# Patient Record
Sex: Female | Born: 1945 | Race: Black or African American | Hispanic: No | Smoking: Never smoker
Health system: Southern US, Community
[De-identification: ages and names within clinical notes are randomized; demographics above are authoritative.]

## PROBLEM LIST (undated history)

## (undated) DIAGNOSIS — R7303 Prediabetes: Secondary | ICD-10-CM

## (undated) DIAGNOSIS — C649 Malignant neoplasm of unspecified kidney, except renal pelvis: Secondary | ICD-10-CM

## (undated) DIAGNOSIS — F32A Depression, unspecified: Secondary | ICD-10-CM

## (undated) DIAGNOSIS — G629 Polyneuropathy, unspecified: Secondary | ICD-10-CM

## (undated) DIAGNOSIS — K219 Gastro-esophageal reflux disease without esophagitis: Secondary | ICD-10-CM

## (undated) DIAGNOSIS — R569 Unspecified convulsions: Secondary | ICD-10-CM

## (undated) DIAGNOSIS — I10 Essential (primary) hypertension: Secondary | ICD-10-CM

## (undated) DIAGNOSIS — M1990 Unspecified osteoarthritis, unspecified site: Secondary | ICD-10-CM

## (undated) DIAGNOSIS — F329 Major depressive disorder, single episode, unspecified: Secondary | ICD-10-CM

## (undated) DIAGNOSIS — F419 Anxiety disorder, unspecified: Secondary | ICD-10-CM

## (undated) DIAGNOSIS — H3530 Unspecified macular degeneration: Secondary | ICD-10-CM

## (undated) HISTORY — PX: ABDOMINAL HYSTERECTOMY: SHX81

## (undated) HISTORY — DX: Polyneuropathy, unspecified: G62.9

## (undated) HISTORY — PX: CATARACT EXTRACTION: SUR2

---

## 2001-12-04 ENCOUNTER — Encounter: Payer: Self-pay | Admitting: Orthopedic Surgery

## 2001-12-04 ENCOUNTER — Ambulatory Visit (HOSPITAL_COMMUNITY): Admission: RE | Admit: 2001-12-04 | Discharge: 2001-12-04 | Payer: Self-pay | Admitting: Orthopedic Surgery

## 2002-01-13 ENCOUNTER — Encounter (HOSPITAL_COMMUNITY): Admission: RE | Admit: 2002-01-13 | Discharge: 2002-02-12 | Payer: Self-pay | Admitting: Orthopedic Surgery

## 2009-07-07 ENCOUNTER — Ambulatory Visit (HOSPITAL_COMMUNITY): Admission: RE | Admit: 2009-07-07 | Discharge: 2009-07-07 | Payer: Self-pay | Admitting: Ophthalmology

## 2010-06-26 LAB — HEMOGLOBIN AND HEMATOCRIT, BLOOD: HCT: 37 % (ref 36.0–46.0)

## 2010-06-26 LAB — BASIC METABOLIC PANEL
CO2: 27 mEq/L (ref 19–32)
Chloride: 107 mEq/L (ref 96–112)
GFR calc Af Amer: >60 mL/min (ref >60)
Glucose, Bld: 102 mg/dL — ABNORMAL HIGH (ref 70–99)
Potassium: 4.1 mEq/L (ref 3.5–5.1)
Sodium: 139 mEq/L (ref 135–145)

## 2011-02-28 ENCOUNTER — Encounter (HOSPITAL_COMMUNITY): Payer: BC Managed Care – PPO

## 2011-03-08 ENCOUNTER — Encounter (HOSPITAL_COMMUNITY): Admission: RE | Payer: Self-pay | Source: Ambulatory Visit

## 2011-03-08 ENCOUNTER — Ambulatory Visit (HOSPITAL_COMMUNITY): Admission: RE | Admit: 2011-03-08 | Payer: BC Managed Care – PPO | Source: Ambulatory Visit | Admitting: Ophthalmology

## 2011-03-08 SURGERY — PHACOEMULSIFICATION, CATARACT, WITH IOL INSERTION
Anesthesia: Monitor Anesthesia Care | Site: Eye | Laterality: Right

## 2012-06-03 NOTE — Patient Instructions (Addendum)
Your procedure is scheduled on: 06/09/2012  Report to [facility] at  730    AM.  Call this number if you have problems the morning of surgery: [phone]   Do not eat food or drink liquids :After Midnight.      Take these medicines the morning of surgery with A SIP OF WATER: none   Do not wear jewelry, make-up or nail polish.  Do not wear lotions, powders, or perfumes.   Do not shave 48 hours prior to surgery.  Do not bring valuables to the hospital.  Contacts, dentures or bridgework may not be worn into surgery.  Leave suitcase in the car. After surgery it may be brought to your room.  For patients admitted to the hospital, checkout time is 11:00 AM the day of discharge.   Patients discharged the day of surgery will not be allowed to drive home.  :     Please read over the following fact sheets that you were given: Coughing and Deep Breathing, Surgical Site Infection Prevention, Anesthesia Post-op Instructions and Care and Recovery After Surgery    Cataract A cataract is a clouding of the lens of the eye. When a lens becomes cloudy, vision is reduced based on the degree and nature of the clouding. Many cataracts reduce vision to some degree. Some cataracts make people more near-sighted as they develop. Other cataracts increase glare. Cataracts that are ignored and become worse can sometimes look white. The white color can be seen through the pupil. CAUSES   Aging. However, cataracts may occur at any age, even in newborns.   Certain drugs.   Trauma to the eye.   Certain diseases such as diabetes.   Specific eye diseases such as chronic inflammation inside the eye or a sudden attack of a rare form of glaucoma.   Inherited or acquired medical problems.  SYMPTOMS   Gradual, progressive drop in vision in the affected eye.   Severe, rapid visual loss. This most often happens when trauma is the cause.  DIAGNOSIS  To detect a cataract, an eye doctor examines the lens. Cataracts are  best diagnosed with an exam of the eyes with the pupils enlarged (dilated) by drops.  TREATMENT  For an early cataract, vision may improve by using different eyeglasses or stronger lighting. If that does not help your vision, surgery is the only effective treatment. A cataract needs to be surgically removed when vision loss interferes with your everyday activities, such as driving, reading, or watching TV. A cataract may also have to be removed if it prevents examination or treatment of another eye problem. Surgery removes the cloudy lens and usually replaces it with a substitute lens (intraocular lens, IOL).  At a time when both you and your doctor agree, the cataract will be surgically removed. If you have cataracts in both eyes, only one is usually removed at a time. This allows the operated eye to heal and be out of danger from any possible problems after surgery (such as infection or poor wound healing). In rare cases, a cataract may be doing damage to your eye. In these cases, your caregiver may advise surgical removal right away. The vast majority of people who have cataract surgery have better vision afterward. HOME CARE INSTRUCTIONS  If you are not planning surgery, you may be asked to do the following:  Use different eyeglasses.   Use stronger or brighter lighting.   Ask your eye doctor about reducing your medicine dose or changing  medicines if it is thought that a medicine caused your cataract. Changing medicines does not make the cataract go away on its own.   Become familiar with your surroundings. Poor vision can lead to injury. Avoid bumping into things on the affected side. You are at a higher risk for tripping or falling.   Exercise extreme care when driving or operating machinery.   Wear sunglasses if you are sensitive to bright light or experiencing problems with glare.  SEEK IMMEDIATE MEDICAL CARE IF:   You have a worsening or sudden vision loss.   You notice redness,  swelling, or increasing pain in the eye.   You have a fever.  Document Released: 03/19/2005 Document Revised: 03/08/2011 Document Reviewed: 11/10/2010 [facility] Patient Information 2012 [place].PATIENT INSTRUCTIONS POST-ANESTHESIA  IMMEDIATELY FOLLOWING SURGERY:  Do not drive or operate machinery for the first twenty four hours after surgery.  Do not make any important decisions for twenty four hours after surgery or while taking narcotic pain medications or sedatives.  If you develop intractable nausea and vomiting or a severe headache please notify your doctor immediately.  FOLLOW-UP:  Please make an appointment with your surgeon as instructed. You do not need to follow up with anesthesia unless specifically instructed to do so.  WOUND CARE INSTRUCTIONS (if applicable):  Keep a dry clean dressing on the anesthesia/puncture wound site if there is drainage.  Once the wound has quit draining you may leave it open to air.  Generally you should leave the bandage intact for twenty four hours unless there is drainage.  If the epidural site drains for more than 36-48 hours please call the anesthesia department.  QUESTIONS?:  Please feel free to call your physician or the hospital operator if you have any questions, and they will be happy to assist you.

## 2012-06-04 ENCOUNTER — Encounter (HOSPITAL_COMMUNITY)
Admission: RE | Admit: 2012-06-04 | Discharge: 2012-06-04 | Disposition: A | Discharge status: Home / Self Care | Payer: BC Managed Care – PPO | Source: Ambulatory Visit

## 2012-06-05 ENCOUNTER — Encounter (HOSPITAL_COMMUNITY)
Admission: RE | Admit: 2012-06-05 | Discharge: 2012-06-05 | Disposition: A | Discharge status: Home / Self Care | Payer: BC Managed Care – PPO | Source: Ambulatory Visit | Attending: Ophthalmology | Admitting: Ophthalmology

## 2012-06-05 ENCOUNTER — Other Ambulatory Visit: Payer: Self-pay

## 2012-06-05 ENCOUNTER — Encounter (HOSPITAL_COMMUNITY): Payer: Self-pay

## 2012-06-05 HISTORY — DX: Depression, unspecified: F32.A

## 2012-06-05 HISTORY — DX: Unspecified convulsions: R56.9

## 2012-06-05 HISTORY — DX: Unspecified osteoarthritis, unspecified site: M19.90

## 2012-06-05 HISTORY — DX: Major depressive disorder, single episode, unspecified: F32.9

## 2012-06-05 LAB — HEMOGLOBIN AND HEMATOCRIT, BLOOD
HCT: 37.3 % (ref 36.0–46.0)
Hemoglobin: 12.7 g/dL (ref 12.0–15.0)

## 2012-06-05 LAB — BASIC METABOLIC PANEL
CO2: 25 mEq/L (ref 19–32)
GFR calc non Af Amer: 60 mL/min — ABNORMAL LOW (ref >90)
Glucose, Bld: 94 mg/dL (ref 70–99)
Potassium: 3.9 mEq/L (ref 3.5–5.1)
Sodium: 136 mEq/L (ref 135–145)

## 2012-06-06 MED ORDER — PHENYLEPHRINE HCL 2.5 % OP SOLN
OPHTHALMIC | Status: AC
  Filled 2012-06-06: qty 2

## 2012-06-06 MED ORDER — LIDOCAINE HCL 3.5 % OP GEL
OPHTHALMIC | Status: AC
  Filled 2012-06-06: qty 5

## 2012-06-06 MED ORDER — NEOMYCIN-POLYMYXIN-DEXAMETH 3.5-10000-0.1 OP OINT
TOPICAL_OINTMENT | OPHTHALMIC | Status: AC
  Filled 2012-06-06: qty 3.5

## 2012-06-06 MED ORDER — TETRACAINE HCL 0.5 % OP SOLN
OPHTHALMIC | Status: AC
  Filled 2012-06-06: qty 2

## 2012-06-06 MED ORDER — CYCLOPENTOLATE-PHENYLEPHRINE 0.2-1 % OP SOLN
OPHTHALMIC | Status: AC
  Filled 2012-06-06: qty 2

## 2012-06-06 MED ORDER — LIDOCAINE HCL (PF) 1 % IJ SOLN
INTRAMUSCULAR | Status: AC
  Filled 2012-06-06: qty 2

## 2012-06-09 ENCOUNTER — Encounter (HOSPITAL_COMMUNITY)
Admission: RE | Disposition: A | Discharge status: Home / Self Care | Payer: Self-pay | Source: Ambulatory Visit | Attending: Ophthalmology

## 2012-06-09 ENCOUNTER — Ambulatory Visit (HOSPITAL_COMMUNITY)
Admission: RE | Admit: 2012-06-09 | Discharge: 2012-06-09 | Disposition: A | Discharge status: Home / Self Care | Payer: BC Managed Care – PPO | Source: Ambulatory Visit | Attending: Ophthalmology | Admitting: Ophthalmology

## 2012-06-09 ENCOUNTER — Ambulatory Visit (HOSPITAL_COMMUNITY): Payer: BC Managed Care – PPO | Admitting: Anesthesiology

## 2012-06-09 ENCOUNTER — Encounter (HOSPITAL_COMMUNITY): Payer: Self-pay | Admitting: *Deleted

## 2012-06-09 ENCOUNTER — Encounter (HOSPITAL_COMMUNITY): Payer: Self-pay | Admitting: Anesthesiology

## 2012-06-09 DIAGNOSIS — Z01810 Encounter for preprocedural cardiovascular examination: Secondary | ICD-10-CM | POA: Insufficient documentation

## 2012-06-09 DIAGNOSIS — H2589 Other age-related cataract: Secondary | ICD-10-CM | POA: Insufficient documentation

## 2012-06-09 DIAGNOSIS — Z01812 Encounter for preprocedural laboratory examination: Secondary | ICD-10-CM | POA: Insufficient documentation

## 2012-06-09 HISTORY — PX: CATARACT EXTRACTION W/PHACO: SHX586

## 2012-06-09 SURGERY — PHACOEMULSIFICATION, CATARACT, WITH IOL INSERTION
Anesthesia: Monitor Anesthesia Care | Site: Eye | Laterality: Right | Wound class: Clean

## 2012-06-09 MED ORDER — POVIDONE-IODINE 5 % OP SOLN
OPHTHALMIC | Status: DC | PRN
  Administered 2012-06-09: 1 via OPHTHALMIC

## 2012-06-09 MED ORDER — NEOMYCIN-POLYMYXIN-DEXAMETH 0.1 % OP OINT
TOPICAL_OINTMENT | OPHTHALMIC | Status: DC | PRN
  Administered 2012-06-09: 1 via OPHTHALMIC

## 2012-06-09 MED ORDER — LIDOCAINE HCL 3.5 % OP GEL
1.0000 "application " | Freq: Once | OPHTHALMIC | Status: AC
  Administered 2012-06-09: 1 via OPHTHALMIC

## 2012-06-09 MED ORDER — PROVISC 10 MG/ML IO SOLN
INTRAOCULAR | Status: DC | PRN
  Administered 2012-06-09: 8.5 mg via INTRAOCULAR

## 2012-06-09 MED ORDER — PHENYLEPHRINE HCL 2.5 % OP SOLN
1.0000 [drp] | OPHTHALMIC | Status: AC
  Administered 2012-06-09 (×3): 1 [drp] via OPHTHALMIC

## 2012-06-09 MED ORDER — MIDAZOLAM HCL 2 MG/2ML IJ SOLN
1.0000 mg | INTRAMUSCULAR | Status: DC | PRN
  Administered 2012-06-09: 2 mg via INTRAVENOUS

## 2012-06-09 MED ORDER — LIDOCAINE HCL (PF) 1 % IJ SOLN
INTRAMUSCULAR | Status: DC | PRN
  Administered 2012-06-09: .5 mL

## 2012-06-09 MED ORDER — EPINEPHRINE HCL 1 MG/ML IJ SOLN
INTRAMUSCULAR | Status: AC
  Filled 2012-06-09: qty 1

## 2012-06-09 MED ORDER — MIDAZOLAM HCL 2 MG/2ML IJ SOLN
INTRAMUSCULAR | Status: AC
  Filled 2012-06-09: qty 2

## 2012-06-09 MED ORDER — TETRACAINE HCL 0.5 % OP SOLN
1.0000 [drp] | OPHTHALMIC | Status: AC
  Administered 2012-06-09 (×3): 1 [drp] via OPHTHALMIC

## 2012-06-09 MED ORDER — BSS IO SOLN
INTRAOCULAR | Status: DC | PRN
  Administered 2012-06-09: 15 mL via INTRAOCULAR

## 2012-06-09 MED ORDER — LACTATED RINGERS IV SOLN
INTRAVENOUS | Status: DC
  Administered 2012-06-09: 09:00:00 via INTRAVENOUS

## 2012-06-09 MED ORDER — CYCLOPENTOLATE-PHENYLEPHRINE 0.2-1 % OP SOLN
1.0000 [drp] | OPHTHALMIC | Status: AC
  Administered 2012-06-09 (×3): 1 [drp] via OPHTHALMIC

## 2012-06-09 MED ORDER — EPINEPHRINE HCL 1 MG/ML IJ SOLN
INTRAOCULAR | Status: DC | PRN
  Administered 2012-06-09: 09:00:00

## 2012-06-09 SURGICAL SUPPLY — 11 items
CLOTH BEACON ORANGE TIMEOUT ST (SAFETY) ×2 IMPLANT
EYE SHIELD UNIVERSAL CLEAR (GAUZE/BANDAGES/DRESSINGS) ×2 IMPLANT
GLOVE BIOGEL PI IND STRL 6.5 (GLOVE) ×1 IMPLANT
GLOVE BIOGEL PI INDICATOR 6.5 (GLOVE) ×1
GLOVE EXAM NITRILE MD LF STRL (GLOVE) ×2 IMPLANT
PAD ARMBOARD 7.5X6 YLW CONV (MISCELLANEOUS) ×2 IMPLANT
SIGHTPATH CAT PROC W REG LENS (Ophthalmic Related) ×2 IMPLANT
SYR TB 1ML LL NO SAFETY (SYRINGE) ×2 IMPLANT
TAPE SURG TRANSPORE 1 IN (GAUZE/BANDAGES/DRESSINGS) ×1 IMPLANT
TAPE SURGICAL TRANSPORE 1 IN (GAUZE/BANDAGES/DRESSINGS) ×1
WATER STERILE IRR 250ML POUR (IV SOLUTION) ×2 IMPLANT

## 2012-06-09 NOTE — Brief Op Note (Signed)
Pre-Op Dx: Cataract OD Post-Op Dx: Cataract OD Surgeon: Hunt, Kerry Anesthesia: Topical with MAC Surgery: Cataract Extraction with Intraocular lens Implant OD Implant: Lenstec, Model Softec HD Blood Loss: None Specimen: None Complications: None 

## 2012-06-09 NOTE — H&P (Signed)
I have reviewed the H&P, the patient was re-examined, and I have identified no interval changes in medical condition and plan of care since the history and physical of record  

## 2012-06-09 NOTE — Anesthesia Preprocedure Evaluation (Signed)
Anesthesia Evaluation  Patient identified by MRN, date of birth, ID band Patient awake    Reviewed: Allergy & Precautions, H&P , NPO status , Patient's Chart, lab work & pertinent test results  History of Anesthesia Complications Negative for: history of anesthetic complications  Airway Mallampati: II      Dental  (+) Edentulous Upper   Pulmonary neg pulmonary ROS,  breath sounds clear to auscultation        Cardiovascular negative cardio ROS  Rhythm:Regular Rate:Normal     Neuro/Psych Seizures -, Well Controlled,  PSYCHIATRIC DISORDERS Depression    GI/Hepatic GERD-  ,  Endo/Other    Renal/GU      Musculoskeletal   Abdominal   Peds  Hematology   Anesthesia Other Findings   Reproductive/Obstetrics                           Anesthesia Physical Anesthesia Plan  ASA: II  Anesthesia Plan:    Post-op Pain Management:    Induction: Intravenous  Airway Management Planned: Nasal Cannula  Additional Equipment:   Intra-op Plan:   Post-operative Plan:   Informed Consent: I have reviewed the patients History and Physical, chart, labs and discussed the procedure including the risks, benefits and alternatives for the proposed anesthesia with the patient or authorized representative who has indicated his/her understanding and acceptance.     Plan Discussed with:   Anesthesia Plan Comments:         Anesthesia Quick Evaluation

## 2012-06-09 NOTE — Transfer of Care (Signed)
Immediate Anesthesia Transfer of Care Note  Patient: Gabriela Wallace  Procedure(s) Performed: Procedure(s) with comments: CATARACT EXTRACTION PHACO AND INTRAOCULAR LENS PLACEMENT (IOC) (Right) - CDE=22.34  Patient Location: Short Stay  Anesthesia Type:MAC  Level of Consciousness: awake, alert , oriented and patient cooperative  Airway & Oxygen Therapy: Patient Spontanous Breathing  Post-op Assessment: Report given to PACU RN, Post -op Vital signs reviewed and stable and Patient moving all extremities  Post vital signs: Reviewed and stable  Complications: No apparent anesthesia complications

## 2012-06-09 NOTE — Anesthesia Postprocedure Evaluation (Signed)
  Anesthesia Post-op Note  Patient: Gabriela Wallace  Procedure(s) Performed: Procedure(s) with comments: CATARACT EXTRACTION PHACO AND INTRAOCULAR LENS PLACEMENT (IOC) (Right) - CDE=22.34  Patient Location: Short Stay  Anesthesia Type:MAC  Level of Consciousness: awake, alert , oriented and patient cooperative  Airway and Oxygen Therapy: Patient Spontanous Breathing  Post-op Pain: none  Post-op Assessment: Post-op Vital signs reviewed, Patient's Cardiovascular Status Stable, Respiratory Function Stable, Patent Airway and Pain level controlled  Post-op Vital Signs: Reviewed and stable  Complications: No apparent anesthesia complications

## 2012-06-10 NOTE — Op Note (Signed)
Gabriela Wallace, HILTON                ACCOUNT NO.:  [id number]  MEDICAL RECORD NO.:  [id number]  LOCATION:  [facility]                          FACILITY:  [facility]  PHYSICIAN:  Susanne Greenhouse, MD       DATE OF BIRTH:  [birth year]  DATE OF PROCEDURE: DATE OF DISCHARGE:  06/09/2012                              OPERATIVE REPORT   PREOPERATIVE DIAGNOSIS:  Combined cataract, right eye, diagnosis code 366.19.  POSTOPERATIVE DIAGNOSIS:  Combined cataract, right eye, diagnosis code 366.19.  PROCEDURE:  Phacoemulsification with posterior chamber intraocular lens implantation, right eye.  SURGEON:  Bonne Dolores. Hunt, MD  ANESTHESIA:  Topical with monitored anesthesia care and IV sedation.  OPERATIVE SUMMARY:  In the preoperative area, dilating drops were placed into the right eye.  The patient was then brought into the operating room where she was placed under general anesthesia.  The eye was then prepped and draped.  Beginning with a 75 blade, a paracentesis port was made at the surgeon's 2 o'clock position.  The anterior chamber was then filled with a 1% nonpreserved lidocaine solution with epinephrine.  This was followed by Viscoat to deepen the chamber.  A small fornix-based peritomy was performed superiorly.  Next, a single iris hook was placed through the limbus superiorly.  A 2.4-mm keratome blade was then used to make a clear corneal incision over the iris hook.  A bent cystotome needle and Utrata forceps were used to create a continuous tear capsulotomy.  Hydrodissection was performed using balanced salt solution on a fine cannula.  The lens nucleus was then removed using phacoemulsification in a quadrant cracking technique.  The cortical material was then removed with irrigation and aspiration.  The capsular bag and anterior chamber were refilled with Provisc.  The wound was widened to approximately 3 mm and a posterior chamber intraocular lens was placed into the capsular bag without difficulty  using an [employer] lens injecting system.  A single 10-0 nylon suture was then used to close the incision as well as stromal hydration.  The Provisc was removed from the anterior chamber and capsular bag with irrigation and aspiration.  At this point, the wounds were tested for leak, which were negative.  The anterior chamber remained deep and stable.  The patient tolerated the procedure well.  There were no operative complications, and she awoke from general anesthesia without problem.  No surgical specimens.  Prosthetic device used is a Lenstec posterior chamber lens, model Softec HD, power is 20.25 serial number is [id number].          ______________________________ Susanne Greenhouse, MD     KEH/MEDQ  D:  06/09/2012  T:  06/10/2012  Job:  [id number]

## 2012-06-11 ENCOUNTER — Encounter (HOSPITAL_COMMUNITY): Payer: Self-pay | Admitting: Ophthalmology

## 2014-06-29 DIAGNOSIS — Z1231 Encounter for screening mammogram for malignant neoplasm of breast: Secondary | ICD-10-CM | POA: Diagnosis not present

## 2014-07-21 DIAGNOSIS — R51 Headache: Secondary | ICD-10-CM | POA: Diagnosis not present

## 2014-07-21 DIAGNOSIS — B349 Viral infection, unspecified: Secondary | ICD-10-CM | POA: Diagnosis not present

## 2014-09-08 DIAGNOSIS — S82831A Other fracture of upper and lower end of right fibula, initial encounter for closed fracture: Secondary | ICD-10-CM | POA: Diagnosis not present

## 2014-09-13 DIAGNOSIS — S82409A Unspecified fracture of shaft of unspecified fibula, initial encounter for closed fracture: Secondary | ICD-10-CM | POA: Diagnosis not present

## 2014-09-17 DIAGNOSIS — S82409A Unspecified fracture of shaft of unspecified fibula, initial encounter for closed fracture: Secondary | ICD-10-CM | POA: Diagnosis not present

## 2014-12-15 DIAGNOSIS — S82409A Unspecified fracture of shaft of unspecified fibula, initial encounter for closed fracture: Secondary | ICD-10-CM | POA: Diagnosis not present

## 2014-12-30 DIAGNOSIS — L600 Ingrowing nail: Secondary | ICD-10-CM | POA: Diagnosis not present

## 2014-12-30 DIAGNOSIS — M79674 Pain in right toe(s): Secondary | ICD-10-CM | POA: Diagnosis not present

## 2014-12-30 DIAGNOSIS — M79671 Pain in right foot: Secondary | ICD-10-CM | POA: Diagnosis not present

## 2014-12-30 DIAGNOSIS — L03031 Cellulitis of right toe: Secondary | ICD-10-CM | POA: Diagnosis not present

## 2014-12-31 DIAGNOSIS — Z8719 Personal history of other diseases of the digestive system: Secondary | ICD-10-CM | POA: Diagnosis not present

## 2014-12-31 DIAGNOSIS — M25561 Pain in right knee: Secondary | ICD-10-CM | POA: Diagnosis not present

## 2014-12-31 DIAGNOSIS — M25562 Pain in left knee: Secondary | ICD-10-CM | POA: Diagnosis not present

## 2014-12-31 DIAGNOSIS — Z6839 Body mass index (BMI) 39.0-39.9, adult: Secondary | ICD-10-CM | POA: Diagnosis not present

## 2015-01-05 DIAGNOSIS — M25561 Pain in right knee: Secondary | ICD-10-CM | POA: Diagnosis not present

## 2015-01-05 DIAGNOSIS — M2391 Unspecified internal derangement of right knee: Secondary | ICD-10-CM | POA: Diagnosis not present

## 2015-01-05 DIAGNOSIS — M179 Osteoarthritis of knee, unspecified: Secondary | ICD-10-CM | POA: Diagnosis not present

## 2015-01-05 DIAGNOSIS — M1711 Unilateral primary osteoarthritis, right knee: Secondary | ICD-10-CM | POA: Diagnosis not present

## 2015-01-05 DIAGNOSIS — Z79899 Other long term (current) drug therapy: Secondary | ICD-10-CM | POA: Diagnosis not present

## 2015-01-14 DIAGNOSIS — Z6841 Body Mass Index (BMI) 40.0 and over, adult: Secondary | ICD-10-CM | POA: Diagnosis not present

## 2015-01-14 DIAGNOSIS — M25561 Pain in right knee: Secondary | ICD-10-CM | POA: Diagnosis not present

## 2015-01-14 DIAGNOSIS — S86911A Strain of unspecified muscle(s) and tendon(s) at lower leg level, right leg, initial encounter: Secondary | ICD-10-CM | POA: Diagnosis not present

## 2015-01-14 DIAGNOSIS — Z8719 Personal history of other diseases of the digestive system: Secondary | ICD-10-CM | POA: Diagnosis not present

## 2015-01-14 DIAGNOSIS — M25461 Effusion, right knee: Secondary | ICD-10-CM | POA: Diagnosis not present

## 2015-01-20 DIAGNOSIS — L03032 Cellulitis of left toe: Secondary | ICD-10-CM | POA: Diagnosis not present

## 2015-01-20 DIAGNOSIS — M79675 Pain in left toe(s): Secondary | ICD-10-CM | POA: Diagnosis not present

## 2015-01-20 DIAGNOSIS — M79672 Pain in left foot: Secondary | ICD-10-CM | POA: Diagnosis not present

## 2015-01-20 DIAGNOSIS — L600 Ingrowing nail: Secondary | ICD-10-CM | POA: Diagnosis not present

## 2015-02-14 ENCOUNTER — Other Ambulatory Visit (HOSPITAL_COMMUNITY): Payer: Self-pay | Admitting: Orthopaedic Surgery

## 2015-02-14 DIAGNOSIS — Z8719 Personal history of other diseases of the digestive system: Secondary | ICD-10-CM | POA: Diagnosis not present

## 2015-02-14 DIAGNOSIS — M25561 Pain in right knee: Secondary | ICD-10-CM

## 2015-02-14 DIAGNOSIS — Z6838 Body mass index (BMI) 38.0-38.9, adult: Secondary | ICD-10-CM | POA: Diagnosis not present

## 2015-02-28 ENCOUNTER — Ambulatory Visit (HOSPITAL_COMMUNITY)
Admission: RE | Admit: 2015-02-28 | Discharge: 2015-02-28 | Disposition: A | Discharge status: Home / Self Care | Payer: BLUE CROSS/BLUE SHIELD | Source: Ambulatory Visit | Attending: Orthopaedic Surgery | Admitting: Orthopaedic Surgery

## 2015-02-28 DIAGNOSIS — M25562 Pain in left knee: Secondary | ICD-10-CM | POA: Diagnosis present

## 2015-02-28 DIAGNOSIS — M179 Osteoarthritis of knee, unspecified: Secondary | ICD-10-CM | POA: Diagnosis not present

## 2015-02-28 DIAGNOSIS — M2341 Loose body in knee, right knee: Secondary | ICD-10-CM | POA: Diagnosis not present

## 2015-02-28 DIAGNOSIS — M23221 Derangement of posterior horn of medial meniscus due to old tear or injury, right knee: Secondary | ICD-10-CM | POA: Insufficient documentation

## 2015-02-28 DIAGNOSIS — M25561 Pain in right knee: Secondary | ICD-10-CM

## 2015-03-07 DIAGNOSIS — M25561 Pain in right knee: Secondary | ICD-10-CM | POA: Diagnosis not present

## 2015-03-07 DIAGNOSIS — Z8719 Personal history of other diseases of the digestive system: Secondary | ICD-10-CM | POA: Diagnosis not present

## 2015-03-07 DIAGNOSIS — Z6838 Body mass index (BMI) 38.0-38.9, adult: Secondary | ICD-10-CM | POA: Diagnosis not present

## 2015-05-26 DIAGNOSIS — Z1211 Encounter for screening for malignant neoplasm of colon: Secondary | ICD-10-CM | POA: Diagnosis not present

## 2015-05-26 DIAGNOSIS — Z8249 Family history of ischemic heart disease and other diseases of the circulatory system: Secondary | ICD-10-CM | POA: Diagnosis not present

## 2015-05-26 DIAGNOSIS — Z6836 Body mass index (BMI) 36.0-36.9, adult: Secondary | ICD-10-CM | POA: Diagnosis not present

## 2015-05-26 DIAGNOSIS — K219 Gastro-esophageal reflux disease without esophagitis: Secondary | ICD-10-CM | POA: Diagnosis not present

## 2015-05-26 DIAGNOSIS — D120 Benign neoplasm of cecum: Secondary | ICD-10-CM | POA: Diagnosis not present

## 2015-05-26 DIAGNOSIS — M1990 Unspecified osteoarthritis, unspecified site: Secondary | ICD-10-CM | POA: Diagnosis not present

## 2015-05-26 DIAGNOSIS — Z90710 Acquired absence of both cervix and uterus: Secondary | ICD-10-CM | POA: Diagnosis not present

## 2015-05-26 DIAGNOSIS — E669 Obesity, unspecified: Secondary | ICD-10-CM | POA: Diagnosis not present

## 2015-06-07 DIAGNOSIS — S82409A Unspecified fracture of shaft of unspecified fibula, initial encounter for closed fracture: Secondary | ICD-10-CM | POA: Diagnosis not present

## 2015-06-22 DIAGNOSIS — G600 Hereditary motor and sensory neuropathy: Secondary | ICD-10-CM | POA: Diagnosis not present

## 2015-06-22 DIAGNOSIS — M79671 Pain in right foot: Secondary | ICD-10-CM | POA: Diagnosis not present

## 2015-06-29 ENCOUNTER — Encounter: Payer: Self-pay | Admitting: Neurology

## 2015-06-29 ENCOUNTER — Other Ambulatory Visit: Payer: Self-pay | Admitting: Neurology

## 2015-06-29 ENCOUNTER — Ambulatory Visit (INDEPENDENT_AMBULATORY_CARE_PROVIDER_SITE_OTHER): Payer: BLUE CROSS/BLUE SHIELD | Admitting: Neurology

## 2015-06-29 VITALS — BP 179/75 | HR 65 | Ht 66.5 in | Wt 226.6 lb

## 2015-06-29 DIAGNOSIS — G63 Polyneuropathy in diseases classified elsewhere: Secondary | ICD-10-CM

## 2015-06-29 DIAGNOSIS — E531 Pyridoxine deficiency: Secondary | ICD-10-CM

## 2015-06-29 DIAGNOSIS — R21 Rash and other nonspecific skin eruption: Secondary | ICD-10-CM

## 2015-06-29 DIAGNOSIS — G629 Polyneuropathy, unspecified: Secondary | ICD-10-CM | POA: Insufficient documentation

## 2015-06-29 DIAGNOSIS — E538 Deficiency of other specified B group vitamins: Secondary | ICD-10-CM

## 2015-06-29 DIAGNOSIS — R7302 Impaired glucose tolerance (oral): Secondary | ICD-10-CM

## 2015-06-29 DIAGNOSIS — Z79899 Other long term (current) drug therapy: Secondary | ICD-10-CM | POA: Diagnosis not present

## 2015-06-29 DIAGNOSIS — E5111 Dry beriberi: Secondary | ICD-10-CM | POA: Diagnosis not present

## 2015-06-29 NOTE — Patient Instructions (Signed)
Remember to drink plenty of fluid, eat healthy meals and do not skip any meals. Try to eat protein with a every meal and eat a healthy snack such as fruit or nuts in between meals. Try to keep a regular sleep-wake schedule and try to exercise daily, particularly in the form of walking, 20-30 minutes a day, if you can.   As far as diagnostic testing and medications: labs, neurontin(gabapentin) 300mg  up to 3x a day. Dermatology referral.  I would like to see you back in 3 months, sooner if we need to. Please call us with any interim questions, concerns, problems, updates or refill requests.   Our phone number is [phone]. We also have an after hours call service for urgent matters and there is a physician on-call for urgent questions. For any emergencies you know to call 911 or go to the nearest emergency room

## 2015-06-29 NOTE — Progress Notes (Signed)
[facility]    Provider:  Dr Jaynee Eagles Referring Provider: Deloria Lair., MD Primary Care Physician:  Deloria Lair, MD  CC:  Pain in the feet  HPI:  Gabriela Wallace is a 70 y.o. female here as a referral from Dr. Scotty Court for pain in the feet. PMHx depression, seizures, arthritis. Started 6 months ago. Her feet go numb and she has burning radiates up the front of the lower leg. Worse at night. Started slowly and progressed. Getting worse. It is continuous daily. No inciting events, no falls, no new medications, no previous illnesses. She has cramps in the feet and legs. More than a year of cramps. She has some back pain but denies any radicular symptoms. No FHx of neuropathy. The hands tingle sometimes. No weakness except she is having arthritis in the kness and that hurts. She has chronic dark spots on the LE, Ongoing for 2 years. Dermatologist gave her some cream. No weakness. No falls. Started in the toes. Denies any back pain or radicular symptoms.   Reviewed notes, labs and imaging from outside physicians, which showed: Reviewed notes from primary care. Patient was seen on 06/22/2015 pain in her right foot and ankle extending to the knee on the anterior aspect of the right lower leg. She reported it was present for several months insidious type of onset. She notices it is worse at night or lying down. She reported the pain as burning. No history in the past. No problems with her back.  MRI of the knee on the right without contrast:  Medial meniscus: There is marked degeneration of the meniscal body and posterior horn. The meniscus is partially extruded from the joint with diffuse degenerative tearing. No clear centrally displaced meniscal fragment identified.  Lateral meniscus: Intact with normal morphology.  LIGAMENTS  Cruciates: Intact with mild ACL mucoid degeneration.  Collaterals: Intact with moderate MCL degeneration and medial  bowing.  CARTILAGE  Patellofemoral: Mild patellar chondral thinning and surface irregularity with prominent diffuse patellofemoral osteophytes.  Medial: Advanced osteoarthritis with diffuse chondral thinning, large osteophytes and subchondral edema.  Lateral: Mild chondral thinning, surface irregularity and osteophyte formation.  OTHER  Joint: Small joint effusion with mild synovial irregularity. There is an ossified loose body anteriorly, inferior to the patellofemoral joint, measuring up to 13 mm on axial image number 18. Possible central loose body as well.  Popliteal Fossa: Unremarkable. No significant Baker's cyst.  Extensor Mechanism: Intact.  Bones: No acute or significant extra-articular osseous findings.  IMPRESSION: 1. Tricompartmental osteoarthritis, advanced within the medial compartment. Associated intra-articular loose bodies. 2. Associated diffuse degenerative tearing of the posterior horn and body of the medial meniscus. 3. ACL mucoid degeneration.  Review of Systems: Patient complains of symptoms per HPI as well as the following symptoms: weight gain, easy bruising, SOB, swelling in legs, SOB, ringing in ears, joint pain, joint swelling, cramps, aching muscles, headache, numbness, sleepiness, snoring, restless kegs. Pertinent negatives per HPI. All others negative.   Social History   Social History  . Marital Status: Widowed    Spouse Name: N/A  . Number of Children: N/A  . Years of Education: 9   Occupational History  . [employer]    Social History Main Topics  . Smoking status: Never Smoker   . Smokeless tobacco: Not on file  . Alcohol Use: No  . Drug Use: No  . Sexual Activity: Yes    Birth Control/ Protection: Surgical   Other Topics Concern  . Not on  file   Social History Narrative   Lives alone   Caffeine use: 2 cups coffee per day    Family History  Problem Relation Age of Onset  . Neuropathy Neg Hx     Past  Medical History  Diagnosis Date  . Depression   . Seizures ([place])     last seizure 10 yrs ago. unknown etiology and on no meds now.  . Arthritis   . Neuropathy [facility])     Past Surgical History  Procedure Laterality Date  . Abdominal hysterectomy    . Cataract extraction      right eye-Dr Hunt-[facility]  . Cataract extraction w/phaco Right 06/09/2012    Procedure: CATARACT EXTRACTION PHACO AND INTRAOCULAR LENS PLACEMENT (IOC);  Surgeon: Tonny Branch, MD;  Location: [facility];  Service: Ophthalmology;  Laterality: Right;  CDE=22.34    Current Outpatient Prescriptions  Medication Sig Dispense Refill  . acetaminophen (TYLENOL) 325 MG tablet Take 650 mg by mouth every 6 (six) hours as needed.    Marland Kitchen ibuprofen (ADVIL,MOTRIN) 200 MG tablet Take 200 mg by mouth every 6 (six) hours as needed for pain.    . naproxen (NAPROSYN) 500 MG tablet Take 500 mg by mouth 2 (two) times daily with a meal.     No current facility-administered medications for this visit.    Allergies as of 06/29/2015  . (No Known Allergies)    Vitals: BP 179/75 mmHg  Pulse 65  Ht 5' 6.5" (1.689 m)  Wt 226 lb 9.6 oz (102.785 kg)  BMI 36.03 kg/m2 Last Weight:  Wt Readings from Last 1 Encounters:  06/29/15 226 lb 9.6 oz (102.785 kg)   Last Height:   Ht Readings from Last 1 Encounters:  06/29/15 5' 6.5" (1.689 m)  Physical exam: Exam: Gen: NAD, conversant, well nourised, obese, well groomed                     CV: RRR, no MRG. No Carotid Bruits. +peripheral edema, warm, nontender Eyes: Conjunctivae clear without exudates or hemorrhage Skin: hyperpigmented area in the lower legs below the knees anteriorly and medial achilles. Venous stasis dermatitis?  Neuro: Detailed Neurologic Exam  Speech:    Speech is normal; fluent and spontaneous with normal comprehension.  Cognition:    The patient is oriented to person, place, and time;     recent and remote memory intact;     language fluent;     normal attention,  concentration,     fund of knowledge Cranial Nerves:    The pupils are equal, round, and reactive to light. The fundi are without edema. Visual fields are full to finger confrontation. Extraocular movements are intact. Trigeminal sensation is intact and the muscles of mastication are normal. The face is symmetric. The palate elevates in the midline. Hearing intact. Voice is normal. Shoulder shrug is normal. The tongue has normal motion without fasciculations.   Coordination:    Normal finger to nose and heel to shin. Normal rapid alternating movements.   Gait:    Heel-toe and tandem gait are normal.   Motor Observation:    No asymmetry, no atrophy, and no involuntary movements noted. Tone:    Normal muscle tone.    Posture:    Posture is normal. normal erect    Strength:    Strength is V/V in the upper and lower limbs.      Sensation: intact to LT. Hyperesthesias and allodynia. Distal decrease to pin prick and temperature. Intact vibration  and proprioception.      Reflex Exam:  DTR's: Absent AJs otherwise deep tendon reflexes in the upper and lower extremities are normal bilaterally.   Toes:    The toes are downgoing bilaterally.   Clonus:    Clonus is absent.        Assessment/Plan:   70 y.o. female here as a referral from Dr. Scotty Court for pain in the feet. PMHx depression, seizures, arthritis. Started 6 months ago now with numbness in the feet and burning in the lower anterior legs. More small fiber involvement on exam.  -complete serum neuropathy panel - She has edema in the lower extremities, emg/ncs may be technically difficult and not reliable. May order after reviewing serum lab results - patient has hyperpigmented areas below the knees. She would like a dermatology referral. May be venous stasis dermatitis given the edema.   Cc: Dr. Pryor Ochoa, [facility] [address] [place], [place] [address]  Phone  [phone] Fax [phone]

## 2015-06-30 ENCOUNTER — Telehealth: Payer: Self-pay | Admitting: Neurology

## 2015-06-30 LAB — PAN-ANCA
ANCA Proteinase 3: <3.5 U/mL (ref 0.0–3.5)
C-ANCA: <1:20 {titer}
P-ANCA: <1:20 {titer}

## 2015-06-30 MED ORDER — GABAPENTIN 300 MG PO CAPS: 300.0000 mg | ORAL_CAPSULE | Freq: Three times a day (TID) | ORAL | Status: DC

## 2015-06-30 NOTE — Addendum Note (Signed)
Addended by: Sarina Ill B on: 06/30/2015 11:54 AM   Modules accepted: Orders

## 2015-06-30 NOTE — Telephone Encounter (Signed)
It was gabapaentin, it is there now thanks

## 2015-06-30 NOTE — Telephone Encounter (Signed)
Patient is calling. She went to [facility] in [place] to pick up her new Rx for naproxen (NAPROSYN) 500 MG tablet last night and the pharmacy said it was not there. Please resend. Thank you.

## 2015-06-30 NOTE — Telephone Encounter (Signed)
Dr Jaynee Eagles- please advise, thank you

## 2015-06-30 NOTE — Telephone Encounter (Signed)
Tried calling pt back. Phone kept ringing. Could not LVM. Please let pt know Dr Jaynee Eagles still seeing pt this morning. We will ask Dr Jaynee Eagles and call her back to advise.

## 2015-06-30 NOTE — Telephone Encounter (Signed)
Pt called to check on medication refill. Please call and update the pt on the status. Thank you

## 2015-06-30 NOTE — Telephone Encounter (Signed)
Please let pt know Dr Jaynee Eagles called in medication if she calls, thank you!

## 2015-07-01 ENCOUNTER — Telehealth: Payer: Self-pay | Admitting: *Deleted

## 2015-07-01 ENCOUNTER — Ambulatory Visit: Payer: BLUE CROSS/BLUE SHIELD | Admitting: Neurology

## 2015-07-01 ENCOUNTER — Other Ambulatory Visit: Payer: Self-pay | Admitting: Neurology

## 2015-07-01 DIAGNOSIS — A530 Latent syphilis, unspecified as early or late: Secondary | ICD-10-CM

## 2015-07-01 NOTE — Telephone Encounter (Signed)
Dr Jaynee Eagles- please advise, thank you!  Cory from [facility] called to report positive syphilis lab result on the pt. He was notified this morning. Advised I will make Dr Jaynee Eagles aware. He wants to know if Dr Jaynee Eagles is going to treat pt or not. Wants a call back to let him know, [phone].

## 2015-07-01 NOTE — Telephone Encounter (Signed)
Spoke to patient regarding her +RPR and reflex testing. She has never been treated for syphilis and is unaware of any exposure. Will refer her to infectious disease for further evaluation. thanks

## 2015-07-04 LAB — MULTIPLE MYELOMA PANEL, SERUM
Albumin SerPl Elph-Mcnc: 3.6 g/dL (ref 2.9–4.4)
Albumin/Glob SerPl: 0.9 (ref 0.7–1.7)
Alpha 1: 0.2 g/dL (ref 0.0–0.4)
Alpha2 Glob SerPl Elph-Mcnc: 0.9 g/dL (ref 0.4–1.0)
B-Globulin SerPl Elph-Mcnc: 1.1 g/dL (ref 0.7–1.3)
Gamma Glob SerPl Elph-Mcnc: 1.9 g/dL — ABNORMAL HIGH (ref 0.4–1.8)
Globulin, Total: 4.2 g/dL — ABNORMAL HIGH (ref 2.2–3.9)
IgG (Immunoglobin G), Serum: 1706 mg/dL — ABNORMAL HIGH (ref 700–1600)
IgM (Immunoglobulin M), Srm: 311 mg/dL — ABNORMAL HIGH (ref 26–217)
Immunoglobulin A, (IgA) QN, Serum: 235 mg/dL (ref 87–352)

## 2015-07-04 LAB — VITAMIN B1: THIAMINE: 123.2 nmol/L (ref 66.5–200.0)

## 2015-07-04 LAB — CBC
HEMATOCRIT: 39.5 % (ref 34.0–46.6)
HEMOGLOBIN: 13.6 g/dL (ref 11.1–15.9)
MCH: 27.1 pg (ref 26.6–33.0)
MCHC: 34.4 g/dL (ref 31.5–35.7)
MCV: 79 fL (ref 79–97)
PLATELETS: 264 10*3/uL (ref 150–379)
RBC: 5.02 x10E6/uL (ref 3.77–5.28)
RDW: 14.9 % (ref 12.3–15.4)
WBC: 4.2 10*3/uL (ref 3.4–10.8)

## 2015-07-04 LAB — HEPATITIS C ANTIBODY: HEP C VIRUS AB: 0.1 {s_co_ratio} (ref 0.0–0.9)

## 2015-07-04 LAB — RPR, QUANT+TP ABS (REFLEX): TREPONEMA PALLIDUM AB: POSITIVE — AB

## 2015-07-04 LAB — COMPREHENSIVE METABOLIC PANEL
A/G RATIO: 1.2 (ref 1.2–2.2)
ALT: 9 IU/L (ref 0–32)
AST: 19 IU/L (ref 0–40)
Albumin: 4.3 g/dL (ref 3.6–4.8)
Alkaline Phosphatase: 86 IU/L (ref 39–117)
BUN/Creatinine Ratio: 15 (ref 11–26)
BUN: 14 mg/dL (ref 8–27)
Bilirubin Total: 0.4 mg/dL (ref 0.0–1.2)
CALCIUM: 9.7 mg/dL (ref 8.7–10.3)
CO2: 21 mmol/L (ref 18–29)
Chloride: 102 mmol/L (ref 96–106)
Creatinine, Ser: 0.93 mg/dL (ref 0.57–1.00)
GFR, EST AFRICAN AMERICAN: 73 mL/min/{1.73_m2} (ref >59)
GFR, EST NON AFRICAN AMERICAN: 63 mL/min/{1.73_m2} (ref >59)
Globulin, Total: 3.5 g/dL (ref 1.5–4.5)
Glucose: 101 mg/dL — ABNORMAL HIGH (ref 65–99)
POTASSIUM: 4.6 mmol/L (ref 3.5–5.2)
Sodium: 139 mmol/L (ref 134–144)
TOTAL PROTEIN: 7.8 g/dL (ref 6.0–8.5)

## 2015-07-04 LAB — HIV ANTIBODY (ROUTINE TESTING W REFLEX): HIV Screen 4th Generation wRfx: NONREACTIVE

## 2015-07-04 LAB — SJOGREN'S SYNDROME ANTIBODS(SSA + SSB)
ENA SSA (RO) Ab: <0.2 AI (ref 0.0–0.9)
ENA SSB (LA) Ab: <0.2 AI (ref 0.0–0.9)

## 2015-07-04 LAB — B12 AND FOLATE PANEL
Folate: 16.6 ng/mL
Vitamin B-12: 518 pg/mL (ref 211–946)

## 2015-07-04 LAB — VITAMIN B6: VITAMIN B6: 18.7 ug/L (ref 2.0–32.8)

## 2015-07-04 LAB — TSH: TSH: 1.38 u[IU]/mL (ref 0.450–4.500)

## 2015-07-04 LAB — HEMOGLOBIN A1C
ESTIMATED AVERAGE GLUCOSE: 126 mg/dL
Hgb A1c MFr Bld: 6 % — ABNORMAL HIGH (ref 4.8–5.6)

## 2015-07-04 LAB — RPR: RPR: REACTIVE — AB

## 2015-07-04 LAB — HEAVY METALS, BLOOD
Arsenic: 12 ug/L (ref 2–23)
Lead, Blood: 1 ug/dL (ref 0–19)
Mercury: 1.7 ug/L (ref 0.0–14.9)

## 2015-07-04 LAB — RHEUMATOID FACTOR: Rheumatoid fact SerPl-aCnc: <10 [IU]/mL (ref 0.0–13.9)

## 2015-07-04 LAB — METHYLMALONIC ACID, SERUM: Methylmalonic Acid: 139 nmol/L (ref 0–378)

## 2015-07-04 LAB — SEDIMENTATION RATE: Sed Rate: 54 mm/hr — ABNORMAL HIGH (ref 0–40)

## 2015-07-04 LAB — ANA W/REFLEX: ANA: NEGATIVE

## 2015-07-04 LAB — ANGIOTENSIN CONVERTING ENZYME: ANGIO CONVERT ENZYME: 22 U/L (ref 14–82)

## 2015-07-05 ENCOUNTER — Telehealth: Payer: Self-pay | Admitting: *Deleted

## 2015-07-05 ENCOUNTER — Telehealth: Payer: Self-pay | Admitting: Neurology

## 2015-07-05 NOTE — Telephone Encounter (Signed)
error 

## 2015-07-05 NOTE — Telephone Encounter (Signed)
-----   Message from Melvenia Beam, MD sent at 07/04/2015  5:09 PM EDT ----- The rest of the labs came back. Of course the RPR was positive and we have referred her to infectious disease. Other than that she is pre-diabetic with hgbA1c 6.0, diet and exercise encouraged. Other than taht labs unremarkable thanks

## 2015-07-05 NOTE — Telephone Encounter (Signed)
Called and spoke to pt about lab results per Dr Jaynee Eagles note. Pt verbalized understanding. Advised for her to limit amount of carbs/sugars in her diet and encouraged daily exercise. She should f/u with PCP to help manage this. She was advised to keep f/u in June w/ Dr Jaynee Eagles.

## 2015-07-05 NOTE — Telephone Encounter (Signed)
Pt requesting Dr Jaynee Eagles to please call her. Thank you

## 2015-07-05 NOTE — Telephone Encounter (Signed)
I called and left a message. She was at work until midnight. Fannie, can you call her back tomorrow and see what she would like? thanks

## 2015-07-06 ENCOUNTER — Ambulatory Visit (INDEPENDENT_AMBULATORY_CARE_PROVIDER_SITE_OTHER): Payer: BLUE CROSS/BLUE SHIELD | Admitting: Orthopaedic Surgery

## 2015-07-06 VITALS — BP 167/86 | HR 79 | Temp 97.5°F | Ht 66.0 in | Wt 226.6 lb

## 2015-07-06 DIAGNOSIS — M25561 Pain in right knee: Secondary | ICD-10-CM

## 2015-07-06 MED ORDER — HYDROCODONE-ACETAMINOPHEN 7.5-325 MG PO TABS: 1.0000 | ORAL_TABLET | ORAL | Status: DC | PRN

## 2015-07-06 NOTE — Telephone Encounter (Signed)
Pt called office back. Advised per Dr Jaynee Eagles that her RPR was positive for syphilis. This could be a false positive. But Dr Jaynee Eagles would like her to f/u with infectious disease to make sure. She should hear within in a week or to from infectious disease to schedule appt. Told her to call back if she does not hear from them. She verbalized understanding.

## 2015-07-06 NOTE — Patient Instructions (Signed)
FOLLOW UP WITH DR HARRISON TO DISCUSS SURGERY

## 2015-07-06 NOTE — Telephone Encounter (Signed)
Tried calling pt. LVM for pt to call back. Gave [facility] phone number.

## 2015-07-06 NOTE — Progress Notes (Signed)
Patient Gabriela Wallace:[id number] NEIDY WINKELMAN, female DOB:May 05, 1945, 70 y.o. [place]:[id number]  Chief Complaint  Patient presents with  . Follow-up    Right knee pain    HPI  Gabriela Wallace is a 70 y.o. female who has right knee pain.  She had  MRI of the right knee done on 02/28/15 which showed tricompartmental DJD and a tera of the medial meniscus.  She has had giving way of the knee, swelling, pain.  She initially declined to consider arthroscopy of the knee but now she would like to do this.  I told her I will have Dr. Aline Brochure see her for this and for him to evaluate her.  She will still have arthritis if the procedure is done   She understands and agrees. HPI  Body mass index is 36.59 kg/(m^2).   Review of Systems  HENT: Negative for congestion.   Respiratory: Negative for cough and shortness of breath.   Cardiovascular: Negative for chest pain and leg swelling.  Endocrine: Positive for cold intolerance.  Musculoskeletal: Positive for joint swelling, arthralgias and gait problem.  Allergic/Immunologic: Negative for environmental allergies.    Past Medical History  Diagnosis Date  . Depression   . Seizures ([place])     last seizure 10 yrs ago. unknown etiology and on no meds now.  . Arthritis   . Neuropathy [facility])     Past Surgical History  Procedure Laterality Date  . Abdominal hysterectomy    . Cataract extraction      right eye-Dr Hunt-[facility]  . Cataract extraction w/phaco Right 06/09/2012    Procedure: CATARACT EXTRACTION PHACO AND INTRAOCULAR LENS PLACEMENT (IOC);  Surgeon: Tonny Branch, MD;  Location: [facility];  Service: Ophthalmology;  Laterality: Right;  CDE=22.34    Family History  Problem Relation Age of Onset  . Neuropathy Neg Hx     Social History Social History  Substance Use Topics  . Smoking status: Never Smoker   . Smokeless tobacco: Not on file  . Alcohol Use: No    No Known Allergies  Current Outpatient Prescriptions  Medication Sig Dispense Refill  . acetaminophen  (TYLENOL) 325 MG tablet Take 650 mg by mouth every 6 (six) hours as needed.    . gabapentin (NEURONTIN) 300 MG capsule Take 1 capsule (300 mg total) by mouth 3 (three) times daily. 90 capsule 11  . ibuprofen (ADVIL,MOTRIN) 200 MG tablet Take 200 mg by mouth every 6 (six) hours as needed for pain.    . naproxen (NAPROSYN) 500 MG tablet Take 500 mg by mouth 2 (two) times daily with a meal.    . HYDROcodone-acetaminophen (NORCO) 7.5-325 MG tablet Take 1 tablet by mouth every 4 (four) hours as needed for moderate pain (Must last 30 days.  Do not drive or operate machinery while taking this medicine.). 120 tablet 0   No current facility-administered medications for this visit.     Physical Exam  Blood pressure 167/86, pulse 79, temperature 97.5 F (36.4 C), height 5\' 6"  (1.676 m), weight 226 lb 9.6 oz (102.785 kg).  Constitutional: overall normal hygiene, normal nutrition, well developed, normal grooming, normal body habitus. Assistive device:none  Musculoskeletal: gait and station Limp right, muscle tone and strength are normal, no tremors or atrophy is present.  .  Neurological: coordination overall normal.  Deep tendon reflex/nerve stretch intact.  Sensation normal.  Cranial nerves II-XII intact.   Skin:   normal overall no scars, lesions, ulcers or rashes. No psoriasis.  Psychiatric: Alert and oriented x 3.  Recent memory intact, remote memory unclear.  Normal mood and affect. Well groomed.  Good eye contact.  Cardiovascular: overall no swelling, no varicosities, no edema bilaterally, normal temperatures of the legs and arms, no clubbing, cyanosis and good capillary refill.  Lymphatic: palpation is normal.  The right lower extremity is examined:  Inspection:  Thigh:  Non-tender and no defects  Knee has swelling 2+ effusion.                        Joint tenderness is present                        Patient is tender over the medial joint line  Lower Leg:  Has normal appearance and  no tenderness or defects  Ankle:  Non-tender and no defects  Foot:  Non-tender and no defects Range of Motion:  Knee:  Range of motion is: 0-015                        Crepitus is  present  Ankle:  Range of motion is normal. Strength and Tone:  The right lower extremity has normal strength and tone. Stability:  Knee:  The knee is stable.  Ankle:  The ankle is stable.  The patient has been educated about the nature of the problem(s) and counseled on treatment options.  The patient appeared to understand what I have discussed and is in agreement with it.  Encounter Diagnosis  Name Primary?  . Right knee pain Yes    PLAN Call if any problems.  Precautions discussed.  Continue current medications.   Return to clinic To see Dr. Aline Brochure for evaulation for possible knee arthroscopy surgery

## 2015-07-19 ENCOUNTER — Ambulatory Visit (INDEPENDENT_AMBULATORY_CARE_PROVIDER_SITE_OTHER): Payer: BLUE CROSS/BLUE SHIELD | Admitting: Orthopaedic Surgery

## 2015-07-19 ENCOUNTER — Encounter: Payer: Self-pay | Admitting: Orthopaedic Surgery

## 2015-07-19 VITALS — BP 161/86 | HR 74 | Temp 98.1°F | Ht 66.0 in | Wt 226.0 lb

## 2015-07-19 DIAGNOSIS — M25561 Pain in right knee: Secondary | ICD-10-CM | POA: Diagnosis not present

## 2015-07-19 MED ORDER — NAPROXEN 500 MG PO TABS: 500.0000 mg | ORAL_TABLET | Freq: Two times a day (BID) | ORAL | Status: DC

## 2015-07-19 NOTE — Progress Notes (Signed)
[name], female   DOB: 25-Jul-1945, 70 y.o.   MRN: [id number]  CC:  My right knee is worse.  It gives way and yesterday it locked up for a while.  She has known tear of the right knee medial meniscus by MRI report.  She was to have appointment to see Dr. Aline Brochure for evaluation for arthroscopy of the knee.  She was given an appointment on April 5th for May 1st, almost a month away.  She has had locking of the knee on the right this past weekend.  She has fallen several times from it giving way.  She has swelling. It is getting worse.  I have asked the staff to see if Dr. Aline Brochure could see her earlier.  The earliest time they have for her now is April 24th.  I will see if he can see her earlier.  Encounter Diagnosis  Name Primary?  . Right knee pain Yes  . She is to continue her crutches.  Her pain medicine.  PROCEDURE NOTE:  The patient requests injections of the right knee , verbal consent was obtained.  The right knee was prepped appropriately after time out was performed.   Sterile technique was observed and injection of 1 cc of Depo-Medrol 40 mg with several cc's of plain xylocaine. Anesthesia was provided by ethyl chloride and a 20-gauge needle was used to inject the knee area. The injection was tolerated well.  A band aid dressing was applied.  The patient was advised to apply ice later today and tomorrow to the injection sight as needed.  To see Dr. Aline Brochure asap.

## 2015-07-19 NOTE — Patient Instructions (Signed)
Please move appt with Dr Aline Brochure up

## 2015-07-20 ENCOUNTER — Encounter: Payer: Self-pay | Admitting: Orthopedic Surgery

## 2015-07-20 ENCOUNTER — Ambulatory Visit (INDEPENDENT_AMBULATORY_CARE_PROVIDER_SITE_OTHER): Payer: Medicare Other | Admitting: Orthopedic Surgery

## 2015-07-20 VITALS — Ht 66.0 in | Wt 226.0 lb

## 2015-07-20 DIAGNOSIS — M2341 Loose body in knee, right knee: Secondary | ICD-10-CM | POA: Diagnosis not present

## 2015-07-20 DIAGNOSIS — M1711 Unilateral primary osteoarthritis, right knee: Secondary | ICD-10-CM | POA: Diagnosis not present

## 2015-07-20 DIAGNOSIS — M23203 Derangement of unspecified medial meniscus due to old tear or injury, right knee: Secondary | ICD-10-CM

## 2015-07-20 DIAGNOSIS — M23206 Derangement of unspecified meniscus due to old tear or injury, right knee: Secondary | ICD-10-CM

## 2015-07-20 DIAGNOSIS — M23200 Derangement of unspecified lateral meniscus due to old tear or injury, right knee: Secondary | ICD-10-CM

## 2015-07-20 NOTE — Patient Instructions (Signed)
You have decided to proceed with operative arthroscopy of the knee. You have decided not to continue with nonoperative measures such as but not limited to oral medication, weight loss, activity modification, physical therapy, bracing, or injection.  We will perform operative arthroscopy of the knee. Some of the risks associated with arthroscopic surgery of the knee include but are not limited to Bleeding Infection Swelling Stiffness Blood clot Pain  If you're not comfortable with these risks and would like to continue with nonoperative treatment please let Dr. Aline Brochure know prior to your surgery.  [id number]  [facility] MENISECTOMY, LOOSE BODY REMOVAL

## 2015-07-21 ENCOUNTER — Encounter: Payer: Self-pay | Admitting: Orthopaedic Surgery

## 2015-07-21 ENCOUNTER — Encounter: Payer: Self-pay | Admitting: Orthopedic Surgery

## 2015-07-21 ENCOUNTER — Other Ambulatory Visit: Payer: Self-pay

## 2015-07-21 ENCOUNTER — Encounter (HOSPITAL_COMMUNITY)
Admission: RE | Admit: 2015-07-21 | Discharge: 2015-07-21 | Disposition: A | Discharge status: Home / Self Care | Payer: BLUE CROSS/BLUE SHIELD | Source: Ambulatory Visit | Attending: Orthopedic Surgery | Admitting: Orthopedic Surgery

## 2015-07-21 ENCOUNTER — Encounter (HOSPITAL_COMMUNITY): Payer: Self-pay

## 2015-07-21 DIAGNOSIS — Z01810 Encounter for preprocedural cardiovascular examination: Secondary | ICD-10-CM | POA: Diagnosis present

## 2015-07-21 NOTE — Pre-Procedure Instructions (Signed)
Patient given information to sign up for my chart at home. 

## 2015-07-21 NOTE — Patient Instructions (Signed)
LAWANA STOESSEL  07/21/2015     @[email or web address] @   Your procedure is scheduled on  07/27/2015   Report to [facility] at  945  A.M.  Call this number if you have problems the morning of surgery:  [phone]   Remember:  Do not eat food or drink liquids after midnight.  Take these medicines the morning of surgery with A SIP OF WATER  Neurontin, hydrocodone.   Do not wear jewelry, make-up or nail polish.  Do not wear lotions, powders, or perfumes.  You may wear deodorant.  Do not shave 48 hours prior to surgery.  Men may shave face and neck.  Do not bring valuables to the hospital.  [facility] is not responsible for any belongings or valuables.  Contacts, dentures or bridgework may not be worn into surgery.  Leave your suitcase in the car.  After surgery it may be brought to your room.  For patients admitted to the hospital, discharge time will be determined by your treatment team.  Patients discharged the day of surgery will not be allowed to drive home.   Name and phone number of your driver:   family Special instructions:  none  Please read over the following fact sheets that you were given. Coughing and Deep Breathing, Surgical Site Infection Prevention, Anesthesia Post-op Instructions and Care and Recovery After Surgery      Meniscus Injury, Arthroscopy Arthroscopy is a surgical procedure that involves the use of a small scope that has a camera and surgical instruments on the end (arthroscope). An arthroscope can be used to repair your meniscus injury.  LET [facility] CARE PROVIDER KNOW ABOUT:  Any allergies you have.  All medicines you are taking, including vitamins, herbs, eyedrops, creams, and over-the-counter medicines.  Any recent colds or infections you have had or currently have.  Previous problems you or members of your family have had with the use of anesthetics.  Any blood disorders or blood clotting problems you have.  Previous  surgeries you have had.  Medical conditions you have. RISKS AND COMPLICATIONS Generally, this is a safe procedure. However, as with any procedure, problems can occur. Possible problems include:  Damage to nerves or blood vessels.  Excess bleeding.  Blood clots.  Infection. BEFORE THE PROCEDURE  Do not eat or drink for 6-8 hours before the procedure.  Take medicines as directed by your surgeon. Ask your surgeon about changing or stopping your regular medicines.  You may have lab tests the morning of surgery. PROCEDURE  You will be given one of the following:   A medicine that numbs the area (local anesthesia).  A medicine that makes you go to sleep (general anesthesia).  A medicine injected into your spine that numbs your body below the waist (spinal anesthesia). Most often, several small cuts (incisions) are made in the knee. The arthroscope and instruments go into the incisions to repair the damage. The torn portion of the meniscus is removed.  During this time, your surgeon may find a partial or complete tear in a cruciate ligament, such as the anterior cruciate ligament (ACL). A completely torn cruciate ligament is reconstructed by taking tissue from another part of the body (grafting) and placing it into the injured area. This requires several larger incisions to complete the repair. Sometimes, open surgery is needed for collateral ligament injuries. If a collateral ligament is found to be injured, your surgeon may staple or suture the tear  through a slightly larger incision on the side of the knee. AFTER THE PROCEDURE You will be taken to the recovery area where your progress will be monitored. When you are awake, stable, and taking fluids without complications, you will be allowed to go home. This is usually the same day. However, more extensive repairs of a ligament may require an overnight stay.  The recovery time after repairing your meniscus or ligament depends on the amount  of damage to these structures. It also depends on whether or not reconstructive knee surgery was needed.   A torn or stretched ligament (ligament sprain) may take 6-8 weeks to heal. It takes about the same amount of time if your surgeon removed a torn meniscus.  A repaired meniscus may require 6-12 weeks of recovery time.  A torn ligament needing reconstructive surgery may take 6-12 months to heal fully.   This information is not intended to replace advice given to you by your health care provider. Make sure you discuss any questions you have with your health care provider.   Document Released: 03/16/2000 Document Revised: 03/24/2013 Document Reviewed: 08/15/2012 Elsevier Interactive Patient Education 2016 [employer]. Arthroscopy, With Meniscus Injury, Care After Refer to this sheet in the next few weeks. These instructions provide you with general information on caring for yourself after your procedure. Your health care provider may also give you specific instructions. Your treatment has been planned according to the current medical practices, but problems sometimes occur. Call your health care provider if you have any problems or questions after your procedure. WHAT TO EXPECT AFTER THE PROCEDURE After your procedure, it is typical to have the following:  Pain and swelling in your knee.  Constipation.  Difficulty walking. HOME CARE INSTRUCTIONS   Use crutches and do knee exercises as directed by your health care provider.  Apply ice to the injured area:  Put ice in a plastic bag.  Place a towel between your skin and the bag.  Leave the ice on for 15-20 minutes, 3-4 times a day while awake. Do this for the first 2 days.  Rest and raise (elevate) your knee.  Change bandages (dressings) as directed by your health care provider.  Keep the wound dry and clean. The wound may be washed gently with soap and water. Gently blot or dab the wound dry. It is okay to take showers 24-48  hours after surgery. Do not take baths, use swimming pools, or use hot tubs for 14 days, or as directed by your health care provider.  Only take over-the-counter or prescription medicines for pain, discomfort, or fever as directed by your health care provider.  Continue your normal diet as directed by your health care provider.  Do not lift anything more than 10 pounds or play contact sports for 3 weeks, or as directed by your health care provider.  If a brace was applied, use as directed by your health care provider.  Your health care provider will help with instructions for rehabilitation of your knee. SEEK MEDICAL CARE IF:   You have increased bleeding (more than a small spot) from the wound.  You have redness, swelling, or increasing pain in the wound.  Yellowish-white fluid (pus) is coming from your wound. SEEK IMMEDIATE MEDICAL CARE IF:   You develop a rash.  You have a fever or persistent symptoms for more than 2-3 days.  You have difficulty breathing.  You have increasing pain with movement of the knee. MAKE SURE YOU:   Understand  these instructions.  Will watch your condition.  Will get help right away if you are not doing well or get worse.   This information is not intended to replace advice given to you by your health care provider. Make sure you discuss any questions you have with your health care provider.   Document Released: 10/06/2004 Document Revised: 11/19/2012 Document Reviewed: 08/26/2012 Elsevier Interactive Patient Education 2016 [employer] PATIENT INSTRUCTIONS POST-ANESTHESIA  IMMEDIATELY FOLLOWING SURGERY:  Do not drive or operate machinery for the first twenty four hours after surgery.  Do not make any important decisions for twenty four hours after surgery or while taking narcotic pain medications or sedatives.  If you develop intractable nausea and vomiting or a severe headache please notify your doctor immediately.  FOLLOW-UP:  Please make  an appointment with your surgeon as instructed. You do not need to follow up with anesthesia unless specifically instructed to do so.  WOUND CARE INSTRUCTIONS (if applicable):  Keep a dry clean dressing on the anesthesia/puncture wound site if there is drainage.  Once the wound has quit draining you may leave it open to air.  Generally you should leave the bandage intact for twenty four hours unless there is drainage.  If the epidural site drains for more than 36-48 hours please call the anesthesia department.  QUESTIONS?:  Please feel free to call your physician or the hospital operator if you have any questions, and they will be happy to assist you.

## 2015-07-21 NOTE — Progress Notes (Signed)
Chief Complaint  Patient presents with  . Knee Injury    right medial meniscus tear   HPI this patient is referred to me by Dr. Luna Glasgow. The patient is having problems with her right knee she's had it for several weeks now she has no aching pain severe requiring opiate analgesia it's become constant and she has locking symptoms severe recently she is brought in for recent locking episode which was severe and Dr. Luna Glasgow felt she needed to be seen as soon as possible for surgery and she is in agreement    ROS She denies constitutional symptoms chest pain shortness of breath constipation frequency it poor healing numbness tingling psychiatric illness anxiety depression easy bruising bruising or excessive thirst   Past Medical History  Diagnosis Date  . Depression   . Arthritis   . Neuropathy ([place])   . Seizures ([place])     last seizure 10 yrs ago. unknown etiology and on no meds now.    Past Surgical History  Procedure Laterality Date  . Abdominal hysterectomy    . Cataract extraction      right eye-Dr Hunt-[facility]  . Cataract extraction w/phaco Right 06/09/2012    Procedure: CATARACT EXTRACTION PHACO AND INTRAOCULAR LENS PLACEMENT (IOC);  Surgeon: Tonny Branch, MD;  Location: [facility];  Service: Ophthalmology;  Laterality: Right;  CDE=22.34   Family History  Problem Relation Age of Onset  . Neuropathy Neg Hx    Social History  Substance Use Topics  . Smoking status: Never Smoker   . Smokeless tobacco: None  . Alcohol Use: Yes     Comment: occassional    Current outpatient prescriptions:  .  acetaminophen (TYLENOL) 325 MG tablet, Take 650 mg by mouth every 6 (six) hours as needed., Disp: , Rfl:  .  gabapentin (NEURONTIN) 300 MG capsule, Take 1 capsule (300 mg total) by mouth 3 (three) times daily., Disp: 90 capsule, Rfl: 11 .  HYDROcodone-acetaminophen (NORCO) 7.5-325 MG tablet, Take 1 tablet by mouth every 4 (four) hours as needed for moderate pain (Must last 30 days.  Do not drive or  operate machinery while taking this medicine.)., Disp: 120 tablet, Rfl: 0 .  naproxen (NAPROSYN) 500 MG tablet, Take 1 tablet (500 mg total) by mouth 2 (two) times daily with a meal., Disp: 60 tablet, Rfl: 5  Ht 5\' 6"  (1.676 m)  Wt 226 lb (102.513 kg)  BMI 36.49 kg/m2  Physical Exam  Constitutional: She is oriented to person, place, and time. She appears well-developed and well-nourished. No distress.  Cardiovascular: Normal rate and intact distal pulses.   Musculoskeletal:  I observed her walking with a walker and severe limp  Her upper extremities show no malalignment contracture subluxation atrophy tremor or skin changes pulse changes edema swelling. Lymph nodes are normal and sensation is normal. Coordination is normal.    Neurological: She is alert and oriented to person, place, and time. She has normal reflexes. She exhibits normal muscle tone. Coordination normal.  Skin: Skin is warm and dry. No rash noted. She is not diaphoretic. No erythema. No pallor.  Psychiatric: She has a normal mood and affect. Her behavior is normal. Judgment and thought content normal.    Ortho Exam  The right lower extremity is neurovascular intact normal skin muscle tone grade 5 strength and muscle tone normal knee stable but positive McMurray sign for torn medial meniscus all ligaments are stable she has 30 of motion she cannot extend the knee cannot flex it past 60  tenderness medial joint line and diffusely around the knee as well.  Her balance is normal  ASSESSMENT: My personal interpretation of the images:  I reviewed her x-rays and MRI and she has a knee with 3 compartment arthritis primarily in the medial compartment she also has loose bodies and she has degenerative tearing of the posterior horn and body of the medial meniscus. Report also was reviewed in and I add that that showed anterior cruciate ligament mucoid degeneration but no tear   Osteoarthritis Torn medial meniscus  degenerative Loose bodies   PLAN She would like to proceed with surgery as soon as possible. The risks and benefits of the surgery and possible complications were reviewed with her and she still wishes to proceed with surgery and we will arrange it as soon as we can get it approved  Arthroscopy right knee medial meniscectomy and removal of loose bodies  Patient aware she may still have pain because of the arthritis

## 2015-07-25 ENCOUNTER — Ambulatory Visit: Payer: BLUE CROSS/BLUE SHIELD | Admitting: Orthopedic Surgery

## 2015-07-25 ENCOUNTER — Telehealth: Payer: Self-pay | Admitting: Neurology

## 2015-07-25 NOTE — Telephone Encounter (Addendum)
[facility] [phone] requesting OV notes. Call was transferred to MR.

## 2015-07-26 NOTE — H&P (Signed)
Chief Complaint   Patient presents with   .  Knee Injury       right medial meniscus tear    HPI this patient is referred to me by Dr. Luna Glasgow. The patient is having problems with her right knee she's had it for several weeks now she has no aching pain severe requiring opiate analgesia it's become constant and she has locking symptoms severe recently she is brought in for recent locking episode which was severe and Dr. Luna Glasgow felt she needed to be seen as soon as possible for surgery and she is in agreement  ROS She denies constitutional symptoms chest pain shortness of breath constipation frequency it poor healing numbness tingling psychiatric illness anxiety depression easy bruising bruising or excessive thirst     Past Medical History   Diagnosis  Date   .  Depression     .  Arthritis     .  Neuropathy ([place])     .  Seizures ([place])         last seizure 10 yrs ago. unknown etiology and on no meds now.       Past Surgical History   Procedure  Laterality  Date   .  Abdominal hysterectomy       .  Cataract extraction           right eye-Dr Hunt-[facility]   .  Cataract extraction w/phaco  Right  06/09/2012       Procedure: CATARACT EXTRACTION PHACO AND INTRAOCULAR LENS PLACEMENT ([facility]);  Surgeon: Tonny Branch, MD;  Location: [facility];  Service: Ophthalmology;  Laterality: Right; CDE=22.34    Family history the patient reported no anesthetic issues or easy bleeding hereditary issues         Neg Hx      Social History   Substance Use Topics   .  Smoking status:  Never Smoker    .  Smokeless tobacco:  None   .  Alcohol Use:  Yes         Comment: occassional     Current outpatient prescriptions:   .  acetaminophen (TYLENOL) 325 MG tablet, Take 650 mg by mouth every 6 (six) hours as needed., Disp: , Rfl:   .  gabapentin (NEURONTIN) 300 MG capsule, Take 1 capsule (300 mg total) by mouth 3 (three) times daily., Disp: 90 capsule, Rfl: 11 .  HYDROcodone-acetaminophen (NORCO) 7.5-325 MG tablet,  Take 1 tablet by mouth every 4 (four) hours as needed for moderate pain (Must last 30 days.  Do not drive or operate machinery while taking this medicine.)., Disp: 120 tablet, Rfl: 0 .  naproxen (NAPROSYN) 500 MG tablet, Take 1 tablet (500 mg total) by mouth 2 (two) times daily with a meal., Disp: 60 tablet, Rfl: 5  Ht 5\' 6"  (1.676 m)  Wt 226 lb (102.513 kg)  BMI 36.49 kg/m2  Physical Exam  Constitutional: She is oriented to person, place, and time. She appears well-developed and well-nourished. No distress.  Cardiovascular: Normal rate and intact distal pulses.   Musculoskeletal:  I observed her walking with a walker and severe limp  Her upper extremities show no malalignment contracture subluxation atrophy tremor or skin changes pulse changes edema swelling. Lymph nodes are normal and sensation is normal. Coordination is normal.   Neurological: She is alert and oriented to person, place, and time. She has normal reflexes. She exhibits normal muscle tone. Coordination normal.  Skin: Skin is warm and dry. No rash noted. She is not  diaphoretic. No erythema. No pallor.  Psychiatric: She has a normal mood and affect. Her behavior is normal. Judgment and thought content normal.    Ortho Exam  The right lower extremity is neurovascular intact normal skin muscle tone grade 5 strength and muscle tone normal knee stable but positive McMurray sign for torn medial meniscus all ligaments are stable she has 30 of motion she cannot extend the knee cannot flex it past 60 tenderness medial joint line and diffusely around the knee as well.  Her balance is normal  ASSESSMENT: My personal interpretation of the images:   I reviewed her x-rays and MRI and she has a knee with 3 compartment arthritis primarily in the medial compartment she also has loose bodies and she has degenerative tearing of the posterior horn and body of the medial meniscus. Report also was reviewed in and I add that that showed  anterior cruciate ligament mucoid degeneration but no tear   Osteoarthritis Torn medial meniscus degenerative Loose bodies   PLAN She would like to proceed with surgery as soon as possible. The risks and benefits of the surgery and possible complications were reviewed with her and she still wishes to proceed with surgery and we will arrange it as soon as we can get it approved  Arthroscopy right knee medial meniscectomy and removal of loose bodies   Patient aware she may still have pain because of the arthritis

## 2015-07-27 ENCOUNTER — Encounter (HOSPITAL_COMMUNITY): Payer: Self-pay | Admitting: *Deleted

## 2015-07-27 ENCOUNTER — Ambulatory Visit (HOSPITAL_COMMUNITY): Payer: BLUE CROSS/BLUE SHIELD | Admitting: Anesthesiology

## 2015-07-27 ENCOUNTER — Encounter (HOSPITAL_COMMUNITY)
Admission: RE | Disposition: A | Discharge status: Home / Self Care | Payer: Self-pay | Source: Ambulatory Visit | Attending: Orthopedic Surgery

## 2015-07-27 ENCOUNTER — Ambulatory Visit (HOSPITAL_COMMUNITY)
Admission: RE | Admit: 2015-07-27 | Discharge: 2015-07-27 | Disposition: A | Discharge status: Home / Self Care | Payer: BLUE CROSS/BLUE SHIELD | Source: Ambulatory Visit | Attending: Orthopedic Surgery | Admitting: Orthopedic Surgery

## 2015-07-27 DIAGNOSIS — M23203 Derangement of unspecified medial meniscus due to old tear or injury, right knee: Secondary | ICD-10-CM | POA: Insufficient documentation

## 2015-07-27 DIAGNOSIS — M2341 Loose body in knee, right knee: Secondary | ICD-10-CM | POA: Diagnosis not present

## 2015-07-27 DIAGNOSIS — F329 Major depressive disorder, single episode, unspecified: Secondary | ICD-10-CM | POA: Insufficient documentation

## 2015-07-27 DIAGNOSIS — M2342 Loose body in knee, left knee: Secondary | ICD-10-CM | POA: Diagnosis not present

## 2015-07-27 DIAGNOSIS — S83242A Other tear of medial meniscus, current injury, left knee, initial encounter: Secondary | ICD-10-CM | POA: Diagnosis not present

## 2015-07-27 DIAGNOSIS — Z7951 Long term (current) use of inhaled steroids: Secondary | ICD-10-CM | POA: Diagnosis not present

## 2015-07-27 DIAGNOSIS — M2340 Loose body in knee, unspecified knee: Secondary | ICD-10-CM | POA: Insufficient documentation

## 2015-07-27 DIAGNOSIS — M1991 Primary osteoarthritis, unspecified site: Secondary | ICD-10-CM | POA: Diagnosis not present

## 2015-07-27 DIAGNOSIS — Z791 Long term (current) use of non-steroidal anti-inflammatories (NSAID): Secondary | ICD-10-CM | POA: Diagnosis not present

## 2015-07-27 DIAGNOSIS — S83249A Other tear of medial meniscus, current injury, unspecified knee, initial encounter: Secondary | ICD-10-CM | POA: Insufficient documentation

## 2015-07-27 DIAGNOSIS — S83241A Other tear of medial meniscus, current injury, right knee, initial encounter: Secondary | ICD-10-CM | POA: Diagnosis not present

## 2015-07-27 HISTORY — PX: KNEE ARTHROSCOPY WITH MEDIAL MENISECTOMY: SHX5651

## 2015-07-27 SURGERY — ARTHROSCOPY, KNEE, WITH MEDIAL MENISCECTOMY
Anesthesia: General | Site: Knee | Laterality: Right

## 2015-07-27 MED ORDER — FENTANYL CITRATE (PF) 100 MCG/2ML IJ SOLN
INTRAMUSCULAR | Status: AC
  Filled 2015-07-27: qty 2

## 2015-07-27 MED ORDER — LACTATED RINGERS IV SOLN
INTRAVENOUS | Status: DC | PRN
  Administered 2015-07-27: 09:00:00 via INTRAVENOUS

## 2015-07-27 MED ORDER — LACTATED RINGERS IV SOLN
INTRAVENOUS | Status: DC
  Administered 2015-07-27: 10:00:00 via INTRAVENOUS

## 2015-07-27 MED ORDER — HYDROCODONE-ACETAMINOPHEN 5-325 MG PO TABS
2.0000 | ORAL_TABLET | Freq: Once | ORAL | Status: AC
  Administered 2015-07-27: 2 via ORAL
  Filled 2015-07-27: qty 2

## 2015-07-27 MED ORDER — LIDOCAINE HCL (CARDIAC) 20 MG/ML IV SOLN
INTRAVENOUS | Status: DC | PRN
  Administered 2015-07-27: 25 mg via INTRAVENOUS

## 2015-07-27 MED ORDER — FENTANYL CITRATE (PF) 250 MCG/5ML IJ SOLN
INTRAMUSCULAR | Status: AC
  Filled 2015-07-27: qty 5

## 2015-07-27 MED ORDER — SODIUM CHLORIDE 0.9 % IJ SOLN
INTRAMUSCULAR | Status: AC
Start: 2015-07-27 — End: 2015-07-27
  Filled 2015-07-27: qty 10

## 2015-07-27 MED ORDER — FENTANYL CITRATE (PF) 100 MCG/2ML IJ SOLN
25.0000 ug | INTRAMUSCULAR | Status: AC
  Administered 2015-07-27 (×2): 25 ug via INTRAVENOUS

## 2015-07-27 MED ORDER — BUPIVACAINE-EPINEPHRINE (PF) 0.5% -1:200000 IJ SOLN
INTRAMUSCULAR | Status: DC | PRN
  Administered 2015-07-27: 60 mL

## 2015-07-27 MED ORDER — ONDANSETRON HCL 4 MG/2ML IJ SOLN: 4.0000 mg | Freq: Once | INTRAMUSCULAR | Status: DC | PRN

## 2015-07-27 MED ORDER — CEFAZOLIN SODIUM-DEXTROSE 2-4 GM/100ML-% IV SOLN
2.0000 g | INTRAVENOUS | Status: AC
  Administered 2015-07-27: 2 g via INTRAVENOUS
  Filled 2015-07-27: qty 100

## 2015-07-27 MED ORDER — BUPIVACAINE-EPINEPHRINE (PF) 0.5% -1:200000 IJ SOLN
INTRAMUSCULAR | Status: AC
  Filled 2015-07-27: qty 30

## 2015-07-27 MED ORDER — FENTANYL CITRATE (PF) 100 MCG/2ML IJ SOLN
25.0000 ug | INTRAMUSCULAR | Status: DC | PRN
  Administered 2015-07-27: 50 ug via INTRAVENOUS

## 2015-07-27 MED ORDER — MIDAZOLAM HCL 2 MG/2ML IJ SOLN
1.0000 mg | INTRAMUSCULAR | Status: DC | PRN
  Administered 2015-07-27 (×2): 2 mg via INTRAVENOUS
  Filled 2015-07-27: qty 2

## 2015-07-27 MED ORDER — ONDANSETRON HCL 4 MG/2ML IJ SOLN
INTRAMUSCULAR | Status: AC
  Filled 2015-07-27: qty 2

## 2015-07-27 MED ORDER — PROPOFOL 10 MG/ML IV BOLUS
INTRAVENOUS | Status: DC | PRN
  Administered 2015-07-27: 160 mg via INTRAVENOUS

## 2015-07-27 MED ORDER — HYDROCODONE-ACETAMINOPHEN 10-325 MG PO TABS: 1.0000 | ORAL_TABLET | Freq: Four times a day (QID) | ORAL | Status: DC | PRN

## 2015-07-27 MED ORDER — CHLORHEXIDINE GLUCONATE 4 % EX LIQD: 60.0000 mL | Freq: Once | CUTANEOUS | Status: DC

## 2015-07-27 MED ORDER — MIDAZOLAM HCL 2 MG/2ML IJ SOLN
INTRAMUSCULAR | Status: AC
  Filled 2015-07-27: qty 2

## 2015-07-27 MED ORDER — EPHEDRINE SULFATE 50 MG/ML IJ SOLN
INTRAMUSCULAR | Status: AC
  Filled 2015-07-27: qty 1

## 2015-07-27 MED ORDER — FENTANYL CITRATE (PF) 100 MCG/2ML IJ SOLN
INTRAMUSCULAR | Status: DC | PRN
  Administered 2015-07-27 (×3): 25 ug via INTRAVENOUS
  Administered 2015-07-27 (×3): 50 ug via INTRAVENOUS
  Administered 2015-07-27: 25 ug via INTRAVENOUS

## 2015-07-27 MED ORDER — KETOROLAC TROMETHAMINE 30 MG/ML IJ SOLN
30.0000 mg | Freq: Once | INTRAMUSCULAR | Status: AC
  Administered 2015-07-27: 30 mg via INTRAVENOUS
  Filled 2015-07-27: qty 1

## 2015-07-27 MED ORDER — PROPOFOL 10 MG/ML IV BOLUS
INTRAVENOUS | Status: AC
  Filled 2015-07-27: qty 20

## 2015-07-27 MED ORDER — ONDANSETRON HCL 4 MG/2ML IJ SOLN
4.0000 mg | Freq: Once | INTRAMUSCULAR | Status: AC
  Administered 2015-07-27: 4 mg via INTRAVENOUS
  Filled 2015-07-27: qty 2

## 2015-07-27 MED ORDER — EPINEPHRINE HCL 1 MG/ML IJ SOLN
INTRAMUSCULAR | Status: AC
  Filled 2015-07-27: qty 4

## 2015-07-27 MED ORDER — SUCCINYLCHOLINE CHLORIDE 20 MG/ML IJ SOLN
INTRAMUSCULAR | Status: AC
  Filled 2015-07-27: qty 1

## 2015-07-27 MED ORDER — LIDOCAINE HCL (PF) 1 % IJ SOLN
INTRAMUSCULAR | Status: AC
  Filled 2015-07-27: qty 5

## 2015-07-27 MED ORDER — BUPIVACAINE-EPINEPHRINE (PF) 0.5% -1:200000 IJ SOLN
INTRAMUSCULAR | Status: AC
  Filled 2015-07-27: qty 60

## 2015-07-27 MED ORDER — SODIUM CHLORIDE 0.9 % IR SOLN
Status: DC | PRN
  Administered 2015-07-27 (×3): 3000 mL

## 2015-07-27 SURGICAL SUPPLY — 53 items
BAG HAMPER (MISCELLANEOUS) ×3 IMPLANT
BANDAGE ELASTIC 6 VELCRO NS (GAUZE/BANDAGES/DRESSINGS) ×3 IMPLANT
BANDAGE ELASTIC 6 VELCRO ST LF (GAUZE/BANDAGES/DRESSINGS) ×3 IMPLANT
BLADE AGGRESSIVE PLUS 4.0 (BLADE) ×3 IMPLANT
BLADE SURG SZ11 CARB STEEL (BLADE) ×3 IMPLANT
CHLORAPREP W/TINT 26ML (MISCELLANEOUS) ×3 IMPLANT
CLOTH BEACON ORANGE TIMEOUT ST (SAFETY) ×3 IMPLANT
COOLER CRYO IC GRAV AND TUBE (ORTHOPEDIC SUPPLIES) ×3 IMPLANT
CUFF CRYO KNEE LG 20X31 COOLER (ORTHOPEDIC SUPPLIES) IMPLANT
CUFF CRYO KNEE18X23 MED (MISCELLANEOUS) ×3 IMPLANT
CUFF TOURNIQUET SINGLE 34IN LL (TOURNIQUET CUFF) ×3 IMPLANT
CUTTER ANGLED DBL BITE 4.5 (BURR) IMPLANT
DECANTER SPIKE VIAL GLASS SM (MISCELLANEOUS) ×6 IMPLANT
DRSG XEROFORM 1X8 (GAUZE/BANDAGES/DRESSINGS) ×3 IMPLANT
GAUZE SPONGE 4X4 12PLY STRL (GAUZE/BANDAGES/DRESSINGS) ×2 IMPLANT
GAUZE SPONGE 4X4 16PLY XRAY LF (GAUZE/BANDAGES/DRESSINGS) ×3 IMPLANT
GAUZE XEROFORM 5X9 LF (GAUZE/BANDAGES/DRESSINGS) ×3 IMPLANT
GLOVE BIO SURGEON STRL SZ7 (GLOVE) ×6 IMPLANT
GLOVE BIOGEL PI IND STRL 7.0 (GLOVE) ×3 IMPLANT
GLOVE BIOGEL PI INDICATOR 7.0 (GLOVE) ×6
GLOVE SKINSENSE NS SZ8.0 LF (GLOVE) ×2
GLOVE SKINSENSE STRL SZ8.0 LF (GLOVE) ×1 IMPLANT
GLOVE SS N UNI LF 8.5 STRL (GLOVE) ×3 IMPLANT
GOWN STRL REUS W/ TWL LRG LVL3 (GOWN DISPOSABLE) ×3 IMPLANT
GOWN STRL REUS W/TWL LRG LVL3 (GOWN DISPOSABLE) ×6
GOWN STRL REUS W/TWL XL LVL3 (GOWN DISPOSABLE) ×3 IMPLANT
HLDR LEG FOAM (MISCELLANEOUS) ×1 IMPLANT
IV NS IRRIG 3000ML ARTHROMATIC (IV SOLUTION) ×9 IMPLANT
KIT BLADEGUARD II DBL (SET/KITS/TRAYS/PACK) ×3 IMPLANT
KIT ROOM TURNOVER AP CYSTO (KITS) ×3 IMPLANT
LEG HOLDER FOAM (MISCELLANEOUS) ×2
MANIFOLD NEPTUNE II (INSTRUMENTS) ×3 IMPLANT
MARKER SKIN DUAL TIP RULER LAB (MISCELLANEOUS) ×3 IMPLANT
NEEDLE HYPO 18GX1.5 BLUNT FILL (NEEDLE) ×3 IMPLANT
NEEDLE HYPO 21X1.5 SAFETY (NEEDLE) ×3 IMPLANT
NEEDLE SPNL 18GX3.5 QUINCKE PK (NEEDLE) ×3 IMPLANT
NS IRRIG 1000ML POUR BTL (IV SOLUTION) ×3 IMPLANT
PACK ARTHRO LIMB DRAPE STRL (MISCELLANEOUS) ×3 IMPLANT
PAD ABD 5X9 TENDERSORB (GAUZE/BANDAGES/DRESSINGS) ×3 IMPLANT
PAD ABD 8X10 STRL (GAUZE/BANDAGES/DRESSINGS) ×3 IMPLANT
PAD ARMBOARD 7.5X6 YLW CONV (MISCELLANEOUS) ×3 IMPLANT
PADDING CAST COTTON 6X4 STRL (CAST SUPPLIES) ×3 IMPLANT
PADDING WEBRIL 6 STERILE (GAUZE/BANDAGES/DRESSINGS) ×3 IMPLANT
SET ARTHROSCOPY INST (INSTRUMENTS) ×3 IMPLANT
SET ARTHROSCOPY PUMP TUBE (IRRIGATION / IRRIGATOR) ×3 IMPLANT
SET BASIN LINEN APH (SET/KITS/TRAYS/PACK) ×3 IMPLANT
SPONGE GAUZE 4X4 12PLY (GAUZE/BANDAGES/DRESSINGS) ×3 IMPLANT
SUT ETHILON 3 0 FSL (SUTURE) ×3 IMPLANT
SYR 30ML LL (SYRINGE) ×3 IMPLANT
SYRINGE 10CC LL (SYRINGE) ×3 IMPLANT
WAND 50 DEG COVAC W/CORD (SURGICAL WAND) IMPLANT
WAND 90 DEG TURBOVAC W/CORD (SURGICAL WAND) ×3 IMPLANT
YANKAUER SUCT BULB TIP 10FT TU (MISCELLANEOUS) ×9 IMPLANT

## 2015-07-27 NOTE — Anesthesia Procedure Notes (Signed)
Procedure Name: LMA Insertion Date/Time: 07/27/2015 11:36 AM Performed by: Andree Elk, Talajah Slimp A Pre-anesthesia Checklist: Patient identified, Timeout performed, Emergency Drugs available, Suction available and Patient being monitored Patient Re-evaluated:Patient Re-evaluated prior to inductionOxygen Delivery Method: Circle system utilized Preoxygenation: Pre-oxygenation with 100% oxygen Intubation Type: IV induction Ventilation: Mask ventilation without difficulty LMA: LMA inserted LMA Size: 4.0 Number of attempts: 1 Placement Confirmation: positive ETCO2 and breath sounds checked- equal and bilateral Tube secured with: Tape Dental Injury: Teeth and Oropharynx as per pre-operative assessment

## 2015-07-27 NOTE — Anesthesia Preprocedure Evaluation (Addendum)
Anesthesia Evaluation  Patient identified by MRN, date of birth, ID band Patient awake    Reviewed: Allergy & Precautions, H&P , NPO status , Patient's Chart, lab work & pertinent test results  History of Anesthesia Complications Negative for: history of anesthetic complications  Airway Mallampati: II       Dental  (+) Edentulous Upper   Pulmonary neg pulmonary ROS,    breath sounds clear to auscultation       Cardiovascular negative cardio ROS   Rhythm:Regular Rate:Normal     Neuro/Psych Seizures - (last 10 yrs ago), Well Controlled,  PSYCHIATRIC DISORDERS Depression    GI/Hepatic negative GI ROS,   Endo/Other    Renal/GU      Musculoskeletal   Abdominal   Peds  Hematology   Anesthesia Other Findings   Reproductive/Obstetrics                             Anesthesia Physical Anesthesia Plan  ASA: II  Anesthesia Plan: General   Post-op Pain Management:    Induction: Intravenous  Airway Management Planned: LMA  Additional Equipment:   Intra-op Plan:   Post-operative Plan: Extubation in OR  Informed Consent: I have reviewed the patients History and Physical, chart, labs and discussed the procedure including the risks, benefits and alternatives for the proposed anesthesia with the patient or authorized representative who has indicated his/her understanding and acceptance.     Plan Discussed with:   Anesthesia Plan Comments:         Anesthesia Quick Evaluation

## 2015-07-27 NOTE — Discharge Instructions (Signed)
Knee Arthroscopy, Care After Refer to this sheet in the next few weeks. These instructions provide you with information about caring for yourself after your procedure. Your health care provider may also give you more specific instructions. Your treatment has been planned according to current medical practices, but problems sometimes occur. Call your health care provider if you have any problems or questions after your procedure. WHAT TO EXPECT AFTER THE PROCEDURE After your procedure, it is common to have:  Soreness.  Pain. HOME CARE INSTRUCTIONS Bathing  Do not take baths, swim, or use a hot tub until your health care provider approves. Incision Care  There are many different ways to close and cover an incision, including stitches, skin glue, and adhesive strips. Follow your health care provider's instructions about:  Incision care.  Bandage (dressing) changes and removal.  Incision closure removal.  Check your incision area every day for signs of infection. Watch for:  Redness, swelling, or pain.  Fluid, blood, or pus. Activity  Avoid strenuous activities for as long as directed by your health care provider.  Return to your normal activities as directed by your health care provider. Ask your health care provider what activities are safe for you.  Perform range-of-motion exercises only as directed by your health care provider.  Do not lift anything that is heavier than 10 lb (4.5 kg).  Do not drive or operate heavy machinery while taking pain medicine.  If you were given crutches, use them as directed by your health care provider. Managing Pain, Stiffness, and Swelling  If directed, apply ice to the injured area:  Put ice in a plastic bag.  Place a towel between your skin and the bag.  Leave the ice on for 20 minutes, 2-3 times per day.  Raise the injured area above the level of your heart while you are sitting or lying down as directed by your health care  provider. General Instructions  Keep all follow-up visits as directed by your health care provider. This is important.  Take medicines only as directed by your health care provider.  Do not use any tobacco products, including cigarettes, chewing tobacco, or electronic cigarettes. If you need help quitting, ask your health care provider.  If you were given compression stockings, wear them as directed by your health care provider. These stockings help prevent blood clots and reduce swelling in your legs. SEEK MEDICAL CARE IF:  You have severe pain with any movement of your knee.  You notice a bad smell coming from the incision or dressing.  You have redness, swelling, or pain at the site of your incision.  You have fluid, blood, or pus coming from your incision. SEEK IMMEDIATE MEDICAL CARE IF:  You develop a rash.  You have a fever.  You have difficulty breathing or have shortness of breath.  You develop pain in your calves or in the back of your knee.  You develop chest pain.  You develop numbness or tingling in your leg or foot.   This information is not intended to replace advice given to you by your health care provider. Make sure you discuss any questions you have with your health care provider.   Document Released: 10/06/2004 Document Revised: 08/03/2014 Document Reviewed: 03/15/2014 Elsevier Interactive Patient Education [employer].

## 2015-07-27 NOTE — Interval H&P Note (Signed)
History and Physical Interval Note:  07/27/2015 10:44 AM  Gabriela Wallace  has presented today for surgery, with the diagnosis of right knee medial meniscus tear, loose body  The various methods of treatment have been discussed with the patient and family. After consideration of risks, benefits and other options for treatment, the patient has consented to  Procedure(s) with comments: KNEE ARTHROSCOPY WITH MEDIAL MENISECTOMY (Right) - loose body removal - no crutch training-Pt notified to arrive at 8:45am KF as a surgical intervention .  The patient's history has been reviewed, patient examined, no change in status, stable for surgery.  I have reviewed the patient's chart and labs.  Questions were answered to the patient's satisfaction.     Arther Abbott

## 2015-07-27 NOTE — Op Note (Signed)
07/27/2015  12:28 PM  PATIENT:  Gabriela Wallace  70 y.o. 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   Delay start of Pharmacological VTE agent (>24hrs) due to surgical blood loss or risk of bleeding: not applicable  Knee arthroscopy dictation  The patient was identified in the preoperative holding area using 2 approved identification mechanisms. The chart was reviewed and updated. The surgical site was confirmed as right knee and marked with an indelible marker.  The patient was taken to the operating room for anesthesia. After successful  general anesthesia, 2 g Ancef was used as IV antibiotics.  The patient was placed in the supine position with the (right) the operative extremity in an arthroscopic leg holder and the opposite extremity in a padded leg holder.  The timeout was executed.  A lateral portal was established with an 11 blade and the scope was introduced into the joint. A diagnostic arthroscopy was performed in  circumferential manner examining the entire knee joint. A medial portal was established and the diagnostic arthroscopy was repeated using a probe to palpate intra-articular structures as they were encountered.     The medial meniscus was resected using a duckbill forceps. The meniscal fragments were removed with a motorized shaver. The meniscus was balanced with a combination of a motorized shaver and a 50 ArthroCare wand until a stable rim was obtained.  The large loose body was in the notch. A large grasper was placed around the loose body and an alligator roll released any soft tissue attachments the medial portal was enlarged and the loose body was removed  We then placed the pump in wash mode and rechecked all gutters and pouch is for any other loose bodies and did not find any we therefore the arthroscopic pump was placed on the wash mode and any excess debris was removed from the joint using suction.  60 cc of Marcaine with epinephrine was injected through the arthroscope.  The portals were closed with 3-0 nylon suture.  A sterile bandage, Ace wrap and Cryo/Cuff was placed and the Cryo/Cuff was activated. The patient was taken to the recovery room in stable condition.

## 2015-07-27 NOTE — Brief Op Note (Addendum)
07/27/2015  12:28 PM  PATIENT:  Gabriela Wallace  70 y.o. 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   Delay start of Pharmacological VTE agent (>24hrs) due to surgical blood loss or risk of bleeding: not applicable  Knee arthroscopy dictation  The patient was identified in the preoperative holding area using 2 approved identification mechanisms. The chart was reviewed and updated. The surgical site was confirmed as right knee and marked with an indelible marker.  The patient was taken to the operating room for anesthesia. After successful  general anesthesia, 2 g Ancef was used as IV antibiotics.  The patient was placed in the supine position with the (right) the operative extremity in an arthroscopic leg holder and the opposite extremity in a padded leg holder.  The timeout was executed.  A lateral portal was established with an 11 blade and the scope was introduced into the joint. A diagnostic arthroscopy was performed in  circumferential manner examining the entire knee joint. A medial portal was established and the diagnostic arthroscopy was repeated using a probe to palpate intra-articular structures as they were encountered.     The medial meniscus was resected using a duckbill forceps. The meniscal fragments were removed with a motorized shaver. The meniscus was balanced with a combination of a motorized shaver and a 50 ArthroCare wand until a stable rim was obtained.  The large loose body was in the notch. A large grasper was placed around the loose body and an alligator roll released any soft tissue attachments the medial portal was enlarged and the loose body was removed  We then placed the pump in wash mode and rechecked all gutters and pouch is for any other loose bodies and did not find any we therefore the arthroscopic pump was placed on the wash mode and any excess debris was removed from the joint using suction.  60 cc of Marcaine with epinephrine was injected through the arthroscope.  The portals were closed with 3-0 nylon suture.  A sterile bandage, Ace wrap and Cryo/Cuff was placed and the Cryo/Cuff was activated. The patient was taken to the recovery room in stable condition.

## 2015-07-27 NOTE — Interval H&P Note (Signed)
History and Physical Interval Note:  07/27/2015 10:58 AM  Gabriela Wallace  has presented today for surgery, with the diagnosis of right knee medial meniscus tear, loose body  The various methods of treatment have been discussed with the patient and family. After consideration of risks, benefits and other options for treatment, the patient has consented to  Procedure(s) with comments: KNEE ARTHROSCOPY WITH MEDIAL MENISECTOMY (Right) - loose body removal - no crutch training-Pt notified to arrive at 8:45am KF as a surgical intervention .  The patient's history has been reviewed, patient examined, no change in status, stable for surgery.  I have reviewed the patient's chart and labs.  Questions were answered to the patient's satisfaction.     Arther Abbott

## 2015-07-27 NOTE — Transfer of Care (Signed)
Immediate Anesthesia Transfer of Care Note  Patient: MILLS AUTRY  Procedure(s) Performed: Procedure(s): KNEE ARTHROSCOPY WITH MEDIAL MENISECTOMY WITH REMOVAL OF LOOSE BODY (Right)  Patient Location: PACU  Anesthesia Type:General  Level of Consciousness: awake, oriented and patient cooperative  Airway & Oxygen Therapy: Patient Spontanous Breathing and Patient connected to face mask oxygen  Post-op Assessment: Report given to RN and Post -op Vital signs reviewed and stable  Post vital signs: Reviewed and stable  Last Vitals:  Filed Vitals:   07/27/15 1115 07/27/15 1120  BP: 136/68 127/75  Temp:    Resp: 16 16    Last Pain: There were no vitals filed for this visit.    Patients Stated Pain Goal: 6 ([id number])  Complications: No apparent anesthesia complications

## 2015-07-28 ENCOUNTER — Encounter (HOSPITAL_COMMUNITY): Payer: Self-pay | Admitting: Orthopedic Surgery

## 2015-07-28 NOTE — Anesthesia Postprocedure Evaluation (Signed)
Anesthesia Post Note Late Entry  Patient: Gabriela Wallace  Procedure(s) Performed: Procedure(s) (LRB): KNEE ARTHROSCOPY WITH MEDIAL MENISECTOMY WITH REMOVAL OF LOOSE BODY (Right)  Patient location during evaluation: PACU Anesthesia Type: General Level of consciousness: awake and alert Pain management: pain level controlled Vital Signs Assessment: post-procedure vital signs reviewed and stable Respiratory status: spontaneous breathing Cardiovascular status: stable Postop Assessment: no signs of nausea or vomiting Anesthetic complications: no    Last Vitals:  Filed Vitals:   07/27/15 1345 07/27/15 1349  BP:  136/68  Pulse: 60 58  Temp:  36.3 C  Resp: 12 20    Last Pain:  Filed Vitals:   07/27/15 1352  PainSc: 3                  Reznor Ferrando A

## 2015-07-29 ENCOUNTER — Ambulatory Visit (INDEPENDENT_AMBULATORY_CARE_PROVIDER_SITE_OTHER): Payer: BLUE CROSS/BLUE SHIELD | Admitting: Orthopedic Surgery

## 2015-07-29 VITALS — BP 140/76 | Ht 66.0 in | Wt 226.0 lb

## 2015-07-29 DIAGNOSIS — Z9889 Other specified postprocedural states: Secondary | ICD-10-CM

## 2015-07-29 DIAGNOSIS — M25461 Effusion, right knee: Secondary | ICD-10-CM

## 2015-07-29 DIAGNOSIS — Z4789 Encounter for other orthopedic aftercare: Secondary | ICD-10-CM

## 2015-07-29 MED ORDER — OXYCODONE-ACETAMINOPHEN 5-325 MG PO TABS: 1.0000 | ORAL_TABLET | ORAL | Status: DC | PRN

## 2015-07-29 NOTE — Patient Instructions (Addendum)
CALL [facility] TO SCHEDULE THERAPY VISITS  Start physical therapy   You have received an injection of steroids into the joint. 15% of patients will have increased pain within the 24 hours postinjection.   This is transient and will go away.   We recommend that you use ice packs on the injection site for 20 minutes every 2 hours and extra strength Tylenol 2 tablets every 8 as needed until the pain resolves.  If you continue to have pain after taking the Tylenol and using the ice please call the office for further instructions.

## 2015-07-29 NOTE — Progress Notes (Signed)
Chief Complaint  Patient presents with  . Follow-up    POST OP #1, [place], [place] 07-27-15.    Postop visit #1 arthr4/26/2017  12:28 PM  PATIENT:  Gabriela Wallace  69 y.o. 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  The medications used were 40 mg of Depo-Medrol and 1% lidocaine 3 cc  Anesthesia was provided by ethyl chloride and the skin was prepped with alcohol.  After cleaning the skin with alcohol an 18-gauge needle was used to aspirate the right knee joint.  We obtained 35 cc of BLOOD  We follow this by injection of 40 mg of Depo-Medrol and 3 cc 1% lidocaine.  There were no complications. A sterile bandage was applied.  She will start therapy on Monday and see me in 4 weeks

## 2015-08-01 ENCOUNTER — Ambulatory Visit: Payer: BLUE CROSS/BLUE SHIELD | Admitting: Orthopedic Surgery

## 2015-08-01 DIAGNOSIS — M25561 Pain in right knee: Secondary | ICD-10-CM | POA: Diagnosis not present

## 2015-08-01 DIAGNOSIS — M25661 Stiffness of right knee, not elsewhere classified: Secondary | ICD-10-CM | POA: Diagnosis not present

## 2015-08-01 DIAGNOSIS — M25461 Effusion, right knee: Secondary | ICD-10-CM | POA: Diagnosis not present

## 2015-08-01 DIAGNOSIS — R262 Difficulty in walking, not elsewhere classified: Secondary | ICD-10-CM | POA: Diagnosis not present

## 2015-08-03 DIAGNOSIS — M25661 Stiffness of right knee, not elsewhere classified: Secondary | ICD-10-CM | POA: Diagnosis not present

## 2015-08-03 DIAGNOSIS — M25561 Pain in right knee: Secondary | ICD-10-CM | POA: Diagnosis not present

## 2015-08-03 DIAGNOSIS — M25461 Effusion, right knee: Secondary | ICD-10-CM | POA: Diagnosis not present

## 2015-08-03 DIAGNOSIS — R262 Difficulty in walking, not elsewhere classified: Secondary | ICD-10-CM | POA: Diagnosis not present

## 2015-08-05 ENCOUNTER — Telehealth: Payer: Self-pay | Admitting: *Deleted

## 2015-08-05 ENCOUNTER — Telehealth: Payer: Self-pay | Admitting: Neurology

## 2015-08-05 DIAGNOSIS — M25461 Effusion, right knee: Secondary | ICD-10-CM | POA: Diagnosis not present

## 2015-08-05 DIAGNOSIS — M25561 Pain in right knee: Secondary | ICD-10-CM | POA: Diagnosis not present

## 2015-08-05 DIAGNOSIS — R262 Difficulty in walking, not elsewhere classified: Secondary | ICD-10-CM | POA: Diagnosis not present

## 2015-08-05 DIAGNOSIS — M25661 Stiffness of right knee, not elsewhere classified: Secondary | ICD-10-CM | POA: Diagnosis not present

## 2015-08-05 NOTE — Telephone Encounter (Signed)
[facility]/Dr. Irving Shows in [place] [phone] called to request notes from patient's visit 06/29/15, states she left a message with medical records 2 weeks ago with no response.

## 2015-08-05 NOTE — Telephone Encounter (Signed)
Notes faxed, to Dr Irving Shows office on 08/05/15.

## 2015-08-09 DIAGNOSIS — M25561 Pain in right knee: Secondary | ICD-10-CM | POA: Diagnosis not present

## 2015-08-09 DIAGNOSIS — M25661 Stiffness of right knee, not elsewhere classified: Secondary | ICD-10-CM | POA: Diagnosis not present

## 2015-08-09 DIAGNOSIS — R262 Difficulty in walking, not elsewhere classified: Secondary | ICD-10-CM | POA: Diagnosis not present

## 2015-08-09 DIAGNOSIS — M25461 Effusion, right knee: Secondary | ICD-10-CM | POA: Diagnosis not present

## 2015-08-10 DIAGNOSIS — M25661 Stiffness of right knee, not elsewhere classified: Secondary | ICD-10-CM | POA: Diagnosis not present

## 2015-08-10 DIAGNOSIS — M25461 Effusion, right knee: Secondary | ICD-10-CM | POA: Diagnosis not present

## 2015-08-10 DIAGNOSIS — M25561 Pain in right knee: Secondary | ICD-10-CM | POA: Diagnosis not present

## 2015-08-10 DIAGNOSIS — R262 Difficulty in walking, not elsewhere classified: Secondary | ICD-10-CM | POA: Diagnosis not present

## 2015-08-12 DIAGNOSIS — M25561 Pain in right knee: Secondary | ICD-10-CM | POA: Diagnosis not present

## 2015-08-12 DIAGNOSIS — R262 Difficulty in walking, not elsewhere classified: Secondary | ICD-10-CM | POA: Diagnosis not present

## 2015-08-12 DIAGNOSIS — M25661 Stiffness of right knee, not elsewhere classified: Secondary | ICD-10-CM | POA: Diagnosis not present

## 2015-08-12 DIAGNOSIS — M25461 Effusion, right knee: Secondary | ICD-10-CM | POA: Diagnosis not present

## 2015-08-12 NOTE — Telephone Encounter (Signed)
done

## 2015-08-15 DIAGNOSIS — M25561 Pain in right knee: Secondary | ICD-10-CM | POA: Diagnosis not present

## 2015-08-15 DIAGNOSIS — M25661 Stiffness of right knee, not elsewhere classified: Secondary | ICD-10-CM | POA: Diagnosis not present

## 2015-08-15 DIAGNOSIS — M25461 Effusion, right knee: Secondary | ICD-10-CM | POA: Diagnosis not present

## 2015-08-15 DIAGNOSIS — R262 Difficulty in walking, not elsewhere classified: Secondary | ICD-10-CM | POA: Diagnosis not present

## 2015-08-16 DIAGNOSIS — M25461 Effusion, right knee: Secondary | ICD-10-CM | POA: Diagnosis not present

## 2015-08-16 DIAGNOSIS — M25561 Pain in right knee: Secondary | ICD-10-CM | POA: Diagnosis not present

## 2015-08-16 DIAGNOSIS — R262 Difficulty in walking, not elsewhere classified: Secondary | ICD-10-CM | POA: Diagnosis not present

## 2015-08-16 DIAGNOSIS — M25661 Stiffness of right knee, not elsewhere classified: Secondary | ICD-10-CM | POA: Diagnosis not present

## 2015-08-18 DIAGNOSIS — M25561 Pain in right knee: Secondary | ICD-10-CM | POA: Diagnosis not present

## 2015-08-18 DIAGNOSIS — R262 Difficulty in walking, not elsewhere classified: Secondary | ICD-10-CM | POA: Diagnosis not present

## 2015-08-18 DIAGNOSIS — M25461 Effusion, right knee: Secondary | ICD-10-CM | POA: Diagnosis not present

## 2015-08-18 DIAGNOSIS — M25661 Stiffness of right knee, not elsewhere classified: Secondary | ICD-10-CM | POA: Diagnosis not present

## 2015-08-22 DIAGNOSIS — R262 Difficulty in walking, not elsewhere classified: Secondary | ICD-10-CM | POA: Diagnosis not present

## 2015-08-22 DIAGNOSIS — M25661 Stiffness of right knee, not elsewhere classified: Secondary | ICD-10-CM | POA: Diagnosis not present

## 2015-08-22 DIAGNOSIS — M25461 Effusion, right knee: Secondary | ICD-10-CM | POA: Diagnosis not present

## 2015-08-22 DIAGNOSIS — M25561 Pain in right knee: Secondary | ICD-10-CM | POA: Diagnosis not present

## 2015-08-24 DIAGNOSIS — I872 Venous insufficiency (chronic) (peripheral): Secondary | ICD-10-CM | POA: Diagnosis not present

## 2015-08-24 DIAGNOSIS — L52 Erythema nodosum: Secondary | ICD-10-CM | POA: Diagnosis not present

## 2015-08-25 DIAGNOSIS — R262 Difficulty in walking, not elsewhere classified: Secondary | ICD-10-CM | POA: Diagnosis not present

## 2015-08-25 DIAGNOSIS — M25461 Effusion, right knee: Secondary | ICD-10-CM | POA: Diagnosis not present

## 2015-08-25 DIAGNOSIS — M25561 Pain in right knee: Secondary | ICD-10-CM | POA: Diagnosis not present

## 2015-08-25 DIAGNOSIS — M25661 Stiffness of right knee, not elsewhere classified: Secondary | ICD-10-CM | POA: Diagnosis not present

## 2015-08-30 ENCOUNTER — Ambulatory Visit: Payer: BLUE CROSS/BLUE SHIELD | Admitting: Orthopedic Surgery

## 2015-08-30 DIAGNOSIS — M25561 Pain in right knee: Secondary | ICD-10-CM | POA: Diagnosis not present

## 2015-08-30 DIAGNOSIS — M25461 Effusion, right knee: Secondary | ICD-10-CM | POA: Diagnosis not present

## 2015-08-30 DIAGNOSIS — R262 Difficulty in walking, not elsewhere classified: Secondary | ICD-10-CM | POA: Diagnosis not present

## 2015-08-30 DIAGNOSIS — M25661 Stiffness of right knee, not elsewhere classified: Secondary | ICD-10-CM | POA: Diagnosis not present

## 2015-08-31 ENCOUNTER — Encounter: Payer: Self-pay | Admitting: Orthopedic Surgery

## 2015-08-31 ENCOUNTER — Ambulatory Visit (INDEPENDENT_AMBULATORY_CARE_PROVIDER_SITE_OTHER): Payer: BLUE CROSS/BLUE SHIELD | Admitting: Orthopedic Surgery

## 2015-08-31 VITALS — BP 140/84 | HR 69 | Ht 66.0 in | Wt 234.0 lb

## 2015-08-31 DIAGNOSIS — Z9889 Other specified postprocedural states: Secondary | ICD-10-CM

## 2015-08-31 DIAGNOSIS — Z4789 Encounter for other orthopedic aftercare: Secondary | ICD-10-CM

## 2015-08-31 NOTE — Patient Instructions (Signed)
RETURN TO WORK Monday June 5

## 2015-09-01 DIAGNOSIS — M25461 Effusion, right knee: Secondary | ICD-10-CM | POA: Diagnosis not present

## 2015-09-01 DIAGNOSIS — M25561 Pain in right knee: Secondary | ICD-10-CM | POA: Diagnosis not present

## 2015-09-01 DIAGNOSIS — M25661 Stiffness of right knee, not elsewhere classified: Secondary | ICD-10-CM | POA: Diagnosis not present

## 2015-09-01 DIAGNOSIS — R262 Difficulty in walking, not elsewhere classified: Secondary | ICD-10-CM | POA: Diagnosis not present

## 2015-09-02 NOTE — Progress Notes (Signed)
Chief Complaint  Patient presents with  . Follow-up    post op 2, [place], [place] 07/27/15   Patient wishes to return to work says her knee is feeling much better wishes to go ahead and try. Her knee looks good today there is no effusion she's regained range of motion including extension  Patient is return to work on Monday, June 5

## 2015-09-05 DIAGNOSIS — M25561 Pain in right knee: Secondary | ICD-10-CM | POA: Diagnosis not present

## 2015-09-05 DIAGNOSIS — R262 Difficulty in walking, not elsewhere classified: Secondary | ICD-10-CM | POA: Diagnosis not present

## 2015-09-05 DIAGNOSIS — M25661 Stiffness of right knee, not elsewhere classified: Secondary | ICD-10-CM | POA: Diagnosis not present

## 2015-09-05 DIAGNOSIS — M25461 Effusion, right knee: Secondary | ICD-10-CM | POA: Diagnosis not present

## 2015-09-07 DIAGNOSIS — M25661 Stiffness of right knee, not elsewhere classified: Secondary | ICD-10-CM | POA: Diagnosis not present

## 2015-09-07 DIAGNOSIS — M25561 Pain in right knee: Secondary | ICD-10-CM | POA: Diagnosis not present

## 2015-09-07 DIAGNOSIS — M25461 Effusion, right knee: Secondary | ICD-10-CM | POA: Diagnosis not present

## 2015-09-07 DIAGNOSIS — R262 Difficulty in walking, not elsewhere classified: Secondary | ICD-10-CM | POA: Diagnosis not present

## 2015-09-08 DIAGNOSIS — I83892 Varicose veins of left lower extremities with other complications: Secondary | ICD-10-CM | POA: Diagnosis not present

## 2015-09-08 DIAGNOSIS — I83812 Varicose veins of left lower extremities with pain: Secondary | ICD-10-CM | POA: Diagnosis not present

## 2015-09-21 ENCOUNTER — Encounter: Payer: Self-pay | Admitting: Neurology

## 2015-09-21 ENCOUNTER — Ambulatory Visit (INDEPENDENT_AMBULATORY_CARE_PROVIDER_SITE_OTHER): Payer: BLUE CROSS/BLUE SHIELD | Admitting: Neurology

## 2015-09-21 VITALS — BP 160/71 | HR 68 | Ht 66.0 in | Wt 224.0 lb

## 2015-09-21 DIAGNOSIS — R7302 Impaired glucose tolerance (oral): Secondary | ICD-10-CM | POA: Diagnosis not present

## 2015-09-21 DIAGNOSIS — G629 Polyneuropathy, unspecified: Secondary | ICD-10-CM

## 2015-09-21 DIAGNOSIS — R7303 Prediabetes: Secondary | ICD-10-CM | POA: Insufficient documentation

## 2015-09-21 DIAGNOSIS — A539 Syphilis, unspecified: Secondary | ICD-10-CM

## 2015-09-21 MED ORDER — GABAPENTIN 300 MG PO CAPS: 900.0000 mg | ORAL_CAPSULE | Freq: Every day | ORAL | Status: DC

## 2015-09-21 NOTE — Patient Instructions (Signed)
Remember to drink plenty of fluid, eat healthy meals and do not skip any meals. Try to eat protein with a every meal and eat a healthy snack such as fruit or nuts in between meals. Try to keep a regular sleep-wake schedule and try to exercise daily, particularly in the form of walking, 20-30 minutes a day, if you can.   As far as your medications are concerned, I would like to suggest: Increase Gabapentin to 2 pills at night then 3 if needed  As far as diagnostic testing: Lumbar Puncture  I would like to see you back in 6 months, sooner if we need to. Please call us with any interim questions, concerns, problems, updates or refill requests.   Please also call us for any test results so we can go over those with you on the phone.  My clinical assistant and will answer any of your questions and relay your messages to me and also relay most of my messages to you.   Our phone number is [phone]. We also have an after hours call service for urgent matters and there is a physician on-call for urgent questions. For any emergencies you know to call 911 or go to the nearest emergency room

## 2015-09-21 NOTE — Progress Notes (Signed)
[facility]    Provider:  Dr Jaynee Eagles Referring Provider: Deloria Lair., MD Primary Care Physician:  Deloria Lair, MD  CC: Pain in the feet  Interval history 09/21/2015: She was never called by ID per patient. 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 Will order a lumbar puncture. Worse at night in bed. Burning and numb toes. neurontin helps at night. During the day she does not need medication it is fine. Daughter here today, discussed neuropathy, we did an extensive panel and only pre-diabetes was found which can cause a small fiber neuropathy. Needs weight loss and diet control. HgbA1c 6.0 all other tests unremarkable exceot for RPR. Discussed lumbar puncture to evaluate for neurosyphilis.   HPI: Gabriela Wallace is a 70 y.o. female here as a referral from Dr. Scotty Court for pain in the feet. PMHx depression, seizures, arthritis. Started 6 months ago. Her feet go numb and she has burning radiates up the front of the lower leg. Worse at night. Started slowly and progressed. Getting worse. It is continuous daily. No inciting events, no falls, no new medications, no previous illnesses. She has cramps in the feet and legs. More than a year of cramps. She has some back pain but denies any radicular symptoms. No FHx of neuropathy. The hands tingle sometimes. No weakness except she is having arthritis in the kness and that hurts. She has chronic dark spots on the LE, Ongoing for 2 years. Dermatologist gave her some cream. No weakness. No falls. Started in the toes. Denies any back pain or radicular symptoms.   Reviewed notes, labs and imaging from outside physicians, which showed: Reviewed notes from primary care. Patient was seen on 06/22/2015 pain in her right foot and ankle extending to the knee on the anterior aspect of the right lower leg. She reported it was present for several months insidious type of onset. She notices it is worse at night or lying down. She reported the pain as burning. No history  in the past. No problems with her back.  MRI of the knee on the right without contrast:  Medial meniscus: There is marked degeneration of the meniscal body and posterior horn. The meniscus is partially extruded from the joint with diffuse degenerative tearing. No clear centrally displaced meniscal fragment identified.  Lateral meniscus: Intact with normal morphology.  LIGAMENTS  Cruciates: Intact with mild ACL mucoid degeneration.  Collaterals: Intact with moderate MCL degeneration and medial bowing.  CARTILAGE  Patellofemoral: Mild patellar chondral thinning and surface irregularity with prominent diffuse patellofemoral osteophytes.  Medial: Advanced osteoarthritis with diffuse chondral thinning, large osteophytes and subchondral edema.  Lateral: Mild chondral thinning, surface irregularity and osteophyte formation.  OTHER  Joint: Small joint effusion with mild synovial irregularity. There is an ossified loose body anteriorly, inferior to the patellofemoral joint, measuring up to 13 mm on axial image number 18. Possible central loose body as well.  Popliteal Fossa: Unremarkable. No significant Baker's cyst.  Extensor Mechanism: Intact.  Bones: No acute or significant extra-articular osseous findings.  IMPRESSION: 1. Tricompartmental osteoarthritis, advanced within the medial compartment. Associated intra-articular loose bodies. 2. Associated diffuse degenerative tearing of the posterior horn and body of the medial meniscus. 3. ACL mucoid degeneration.  Review of Systems: Patient complains of symptoms per HPI as well as the following symptoms: weight gain, easy bruising, SOB, swelling in legs, SOB, ringing in ears, joint pain, joint swelling, cramps, aching muscles, headache, numbness, sleepiness, snoring, restless kegs. Pertinent negatives per HPI. All others negative.  Social History   Social History  . Marital Status: Widowed    Spouse Name: N/A  .  Number of Children: N/A  . Years of Education: 9   Occupational History  . [employer]    Social History Main Topics  . Smoking status: Never Smoker   . Smokeless tobacco: Not on file  . Alcohol Use: Yes     Comment: occassional  . Drug Use: No  . Sexual Activity: Yes    Birth Control/ Protection: Surgical   Other Topics Concern  . Not on file   Social History Narrative   Lives alone   Caffeine use: 2 cups coffee per day    Family History  Problem Relation Age of Onset  . Neuropathy Neg Hx     Past Medical History  Diagnosis Date  . Depression   . Arthritis   . Neuropathy ([place])   . Seizures ([place])     last seizure 10 yrs ago. unknown etiology and on no meds now.    Past Surgical History  Procedure Laterality Date  . Abdominal hysterectomy    . Cataract extraction      right eye-Dr Hunt-[facility]  . Cataract extraction w/phaco Right 06/09/2012    Procedure: CATARACT EXTRACTION PHACO AND INTRAOCULAR LENS PLACEMENT (IOC);  Surgeon: Tonny Branch, MD;  Location: [facility];  Service: Ophthalmology;  Laterality: Right;  CDE=22.34  . Knee arthroscopy with medial menisectomy Right 07/27/2015    Procedure: KNEE ARTHROSCOPY WITH MEDIAL MENISECTOMY WITH REMOVAL OF LOOSE BODY;  Surgeon: Carole Civil, MD;  Location: [facility];  Service: Orthopedics;  Laterality: Right;    Current Outpatient Prescriptions  Medication Sig Dispense Refill  . gabapentin (NEURONTIN) 300 MG capsule Take 1 capsule (300 mg total) by mouth 3 (three) times daily. 90 capsule 11  . HYDROcodone-acetaminophen (NORCO) 10-325 MG tablet Take 1 tablet by mouth every 6 (six) hours as needed. 30 tablet 0  . naproxen (NAPROSYN) 500 MG tablet Take 1 tablet (500 mg total) by mouth 2 (two) times daily with a meal. 60 tablet 5   No current facility-administered medications for this visit.    Allergies as of 09/21/2015  . (No Known Allergies)    Vitals: BP 160/71 mmHg  Pulse 68  Ht 5\' 6"  (1.676 m)  Wt 224 lb  (101.606 kg)  BMI 36.17 kg/m2 Last Weight:  Wt Readings from Last 1 Encounters:  09/21/15 224 lb (101.606 kg)   Last Height:   Ht Readings from Last 1 Encounters:  09/21/15 5\' 6"  (1.676 m)     Physical exam: Exam: Gen: NAD, conversant, well nourised, obese, well groomed  CV: RRR, no MRG. No Carotid Bruits. +peripheral edema, warm, nontender Eyes: Conjunctivae clear without exudates or hemorrhage Skin: hyperpigmented area in the lower legs below the knees anteriorly and medial achilles. Venous stasis dermatitis?  Neuro: Detailed Neurologic Exam  Speech:  Speech is normal; fluent and spontaneous with normal comprehension.  Cognition:  The patient is oriented to person, place, and time;   recent and remote memory intact;   language fluent;   normal attention, concentration,   fund of knowledge Cranial Nerves:  The pupils are equal, round, and reactive to light. The fundi are without edema. Visual fields are full to finger confrontation. Extraocular movements are intact. Trigeminal sensation is intact and the muscles of mastication are normal. The face is symmetric. The palate elevates in the midline. Hearing intact. Voice is normal. Shoulder shrug is normal. The tongue has normal  motion without fasciculations.   Coordination:  Normal finger to nose and heel to shin. Normal rapid alternating movements.   Gait:  Heel-toe and tandem gait are normal.   Motor Observation:  No asymmetry, no atrophy, and no involuntary movements noted. Tone:  Normal muscle tone.   Posture:  Posture is normal. normal erect   Strength:  Strength is V/V in the upper and lower limbs.    Sensation: intact to LT. Hyperesthesias and allodynia. Distal decrease to pin prick and temperature. Intact vibration and proprioception.    Reflex Exam:  DTR's: Absent AJs otherwise deep tendon reflexes in the upper and lower extremities are normal  bilaterally.  Toes:  The toes are downgoing bilaterally.  Clonus:  Clonus is absent.    Assessment/Plan: 70 y.o. female here as a referral from Dr. Scotty Court for pain in the feet. PMHx depression, seizures, arthritis. Started 6 months ago now with numbness in the feet and burning in the lower anterior legs. More small fiber involvement on exam.  -complete serum neuropathy panel was unremarkable except +RPR and HgbA1c 6.0. Elevated glucose can cause peripheral small-fiber neuropathy. She needs weight loss and tight glucose control.  - RPR was positive. Referred her to ID, patient says they never called. Will order lumbar puncture and test for VDRL.  - patient has hyperpigmented areas below the knees. She would like a dermatology referral. May be venous stasis dermatitis given the edema. Referred.  - Increase gabapentin at night.   Cc:Dr. Pryor Ochoa, MD  [facility] [address] [place], [place] [address]  Phone [phone] Fax [phone]  A total of 30 minutes was spent face-to-face with this patient. Over half this time was spent on counseling patient on the small-fiber neuropathy, +RPR diagnosis and different diagnostic and therapeutic options available.

## 2015-09-28 ENCOUNTER — Ambulatory Visit: Payer: BLUE CROSS/BLUE SHIELD | Admitting: Neurology

## 2015-09-29 ENCOUNTER — Other Ambulatory Visit: Payer: BLUE CROSS/BLUE SHIELD

## 2015-10-12 ENCOUNTER — Ambulatory Visit
Admission: RE | Admit: 2015-10-12 | Discharge: 2015-10-12 | Disposition: A | Discharge status: Home / Self Care | Payer: BLUE CROSS/BLUE SHIELD | Source: Ambulatory Visit | Attending: Neurology | Admitting: Neurology

## 2015-10-12 ENCOUNTER — Other Ambulatory Visit: Payer: Self-pay | Admitting: Neurology

## 2015-10-12 DIAGNOSIS — A539 Syphilis, unspecified: Secondary | ICD-10-CM

## 2015-10-12 DIAGNOSIS — R200 Anesthesia of skin: Secondary | ICD-10-CM | POA: Diagnosis not present

## 2015-10-12 LAB — CSF CELL COUNT WITH DIFFERENTIAL
RBC COUNT CSF: 1 {cells}/uL (ref 0–10)
WBC, CSF: 0 cells/uL (ref 0–5)

## 2015-10-12 LAB — PROTEIN, CSF: Total Protein, CSF: 35 mg/dL (ref 15–60)

## 2015-10-12 LAB — GLUCOSE, CSF: Glucose, CSF: 55 mg/dL (ref 43–76)

## 2015-10-12 NOTE — Discharge Instructions (Signed)

## 2015-10-13 ENCOUNTER — Telehealth: Payer: Self-pay | Admitting: Neurology

## 2015-10-13 LAB — VDRL, CSF: VDRL Quant, CSF: NONREACTIVE

## 2015-10-13 NOTE — Telephone Encounter (Signed)
VDRL in the csf was negative. She does not have neurosyphilis. She does still need to be treated for the syphilis now however. She needs an intramuscular injection of long acting Benzathine penicillin G (2.4 million units administered intramuscularly) every week for 3 weeks. We cannot have it completed here in our office. Please ask who her primary care doctor is and call and see if we can have it administered there. Unclear how long she has had syphilis so would err on the side of safety and give the shot 3 times over 3 weeks as opposed to once. Please let patient know she needs these shots and even though she does no have neurosyphilis she still needs to be treated so she does not develop neurosyphilis or her syphilis does not progress, make sure she understands that Thanks.

## 2015-10-14 NOTE — Telephone Encounter (Signed)
I called and spoke to pt and relayed that she does not have neurosyphilis , the VDRL in the csf is negative.   Very important to have Pen G injections every week for 3 weeks so as not to develop neurosyphhilis or disease progression.  She has pcp, Dr. Scotty Court and I called and LMVM for Lavella Hammock nurse at [facility] to call me back.

## 2015-10-14 NOTE — Telephone Encounter (Signed)
I LM with family member to have pt call me.

## 2015-10-14 NOTE — Telephone Encounter (Signed)
I spoke with cindy with [facility] office.   They only have rocephin, not PEN G.  I spoke with pt.  She will call Health Dept in [place] to see about getting treated.  I called [phone] spoke to Phylis Bougie, with [employer].  She will call pt. I faxed over test results to her at [phone] (received fax confirmation).  I spoke to pt and she had gotten in touch with Phylis Bougie with Health Dept.  They will take over from here relating to treatment.

## 2015-10-14 NOTE — Telephone Encounter (Signed)
Pt returned call. Will be at home most of the day.

## 2015-10-15 LAB — CSF CULTURE
GRAM STAIN: NONE SEEN
GRAM STAIN: NONE SEEN
ORGANISM ID, BACTERIA: NO GROWTH

## 2015-10-18 ENCOUNTER — Telehealth: Payer: Self-pay | Admitting: *Deleted

## 2015-10-18 NOTE — Telephone Encounter (Signed)
Looking on referral note, Per Dr. Johnnye Sima MD, If appt desired call MD on MD call.  Noted 07-06-2015.  I called to see about them seeing pt as per [facility] stated due to age, recommended that pt be seen by ID.  Attempted to call and was disconnected.  Will attempt later.

## 2015-10-18 NOTE — Telephone Encounter (Signed)
Candace/ ID called back and said she will call back after 1:30.May  [phone]

## 2015-10-18 NOTE — Telephone Encounter (Addendum)
Noreene Larsson from [facility]. called about pt. [phone]).  They spoke to DIS about pt and they stated that due to pts age, they would recommend pt see infectious Disease.  She stated that there is a bicillin shortage and they have a few doses for active cases.  May use doxycycline for 2 wks if appropriate for pt.  I was unsure if ID called pt, but noted in 09/2015 ofv note with Dr. Jaynee Eagles, that pt did not receive call.  I spoke with margaret with ID [phone].   They stated from a referral  note 09-21-15 that Dr. Johnnye Sima would not see in office, but would talk to MD on call.  Last note LP done. (negative neurosyphilis). Still recommend getting treated.

## 2015-10-18 NOTE — Telephone Encounter (Addendum)
I called Candace back.  She gave me Dr. Karolee Ohs pager [phone] for Dr. Jaynee Eagles to call when she returns on Tuesday, relating to referral to ID for this pt, (who is 70 yrs old and treatment needed).  I attempted to call pt and her cell# was not set up yet.  Dr. Scotty Court is her pcp.

## 2015-10-19 DIAGNOSIS — I83812 Varicose veins of left lower extremities with pain: Secondary | ICD-10-CM | POA: Diagnosis not present

## 2015-10-19 DIAGNOSIS — I8312 Varicose veins of left lower extremity with inflammation: Secondary | ICD-10-CM | POA: Diagnosis not present

## 2015-10-19 NOTE — Telephone Encounter (Signed)
I called and was not able to LM as cell 3 VM not set up. Home # no answer.

## 2015-10-19 NOTE — Telephone Encounter (Signed)
Thank you. Infectious disease wouldn't see patient, they told me to order the LP, She was negative. I recommended Benzathine pcn 2.63million units once a week for 3 weeks. But if the health department wants her to see infectious disease then I will call them, maybe they will see her. Would you keep trying to let patient know? Call her today and tomorrow, tell her we will figure it out. thanks

## 2015-10-19 NOTE — Telephone Encounter (Signed)
I spoke to pt.  I relayed that Dr. Jaynee Eagles will call ID when she returns.  I told her that she had spoken to them,ID and they recommending the LP which was done and negative results which is good.  Still recommends IM injections, but will speak to ID since HD recommended.  Pt verbalized understanding.

## 2015-10-24 NOTE — Telephone Encounter (Signed)
I spoke to Gabriela Wallace. She is driving currently, but I sent a message asking for guidelines in cases such as this and she said she would respond thanks.

## 2015-10-25 DIAGNOSIS — I83812 Varicose veins of left lower extremities with pain: Secondary | ICD-10-CM | POA: Diagnosis not present

## 2015-10-25 DIAGNOSIS — I8312 Varicose veins of left lower extremity with inflammation: Secondary | ICD-10-CM | POA: Diagnosis not present

## 2015-10-25 DIAGNOSIS — I83892 Varicose veins of left lower extremities with other complications: Secondary | ICD-10-CM | POA: Diagnosis not present

## 2015-10-31 ENCOUNTER — Encounter: Payer: Self-pay | Admitting: Orthopedic Surgery

## 2015-10-31 ENCOUNTER — Ambulatory Visit (INDEPENDENT_AMBULATORY_CARE_PROVIDER_SITE_OTHER): Payer: BLUE CROSS/BLUE SHIELD | Admitting: Orthopedic Surgery

## 2015-10-31 VITALS — BP 156/84 | Ht 65.0 in | Wt 234.0 lb

## 2015-10-31 DIAGNOSIS — M129 Arthropathy, unspecified: Secondary | ICD-10-CM | POA: Diagnosis not present

## 2015-10-31 DIAGNOSIS — M1711 Unilateral primary osteoarthritis, right knee: Secondary | ICD-10-CM

## 2015-10-31 DIAGNOSIS — Z9889 Other specified postprocedural states: Secondary | ICD-10-CM

## 2015-10-31 MED ORDER — DICLOFENAC POTASSIUM 50 MG PO TABS: 50.0000 mg | ORAL_TABLET | Freq: Two times a day (BID) | ORAL | 1 refills | Status: DC

## 2015-10-31 NOTE — Telephone Encounter (Signed)
Thank you :)

## 2015-10-31 NOTE — Telephone Encounter (Signed)
Dr Jaynee Eagles- per phone note from Marval Regal, RN : "I spoke with cindy with Dr. Scotty Court office.   They only have rocephin, not PEN G.  I spoke with pt.  She will call Health Dept in [place] to see about getting treated.  I called [phone] spoke to Phylis Bougie, with [employer].  She will call pt. I faxed over test results to her at [phone] (received fax confirmation).  I spoke to pt and she had gotten in touch with Phylis Bougie with Health Dept.  They will take over from here relating to treatment."  Do you want me to do anything further?

## 2015-10-31 NOTE — Telephone Encounter (Signed)
I spoke to [facility]. They do not need to see her. Our plan for 3 weeks of pcn once weekly is acurate. Can you get it arranged please and let patient know? thanks

## 2015-10-31 NOTE — Patient Instructions (Signed)
OOW X TODAY   ICE AS NEEDED   START ARTHRITIS MEDICATION   You have received an injection of steroids into the joint. 15% of patients will have increased pain within the 24 hours postinjection.   This is transient and will go away.   We recommend that you use ice packs on the injection site for 20 minutes every 2 hours and extra strength Tylenol 2 tablets every 8 as needed until the pain resolves.  If you continue to have pain after taking the Tylenol and using the ice please call the office for further instructions.

## 2015-10-31 NOTE — Progress Notes (Signed)
Chief Complaint  Patient presents with  . Follow-up    RIGHT KNEE, SARK 07/27/15    This patient had a knee arthroscopy in April. Did well postoperatively back to work. Start having pain and swelling. Op notice follows.  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 Quality dull aching pain. Severity moderate. Duration several weeks. Timing exercise-related contexts worsening. Associated symptoms swelling and trouble bending the knee   Review of Systems  Constitutional: Negative for chills and fever.  Musculoskeletal: Positive for joint pain.  Neurological: Negative for tingling and sensory change.   Past Medical History:  Diagnosis Date  . Arthritis   . Depression   . Neuropathy ([place])   . Seizures ([place])    last seizure 10 yrs ago. unknown etiology and on no meds now.     BP (!) 156/84   Ht 5\' 5"  (1.651 m)   Wt 234 lb (106.1 kg)   BMI 38.94 kg/m   Gen. appearance well-developed well-nourished grooming hygiene normal. Oriented 3. Mood affect normal pleasant. Gait and station slight limp favoring the right leg  Right knee has a small effusion if not moderate effusion. Knee flexion is 115. Knee extension 5. Knee stable in flexion and in extension. Strength and muscle tone normal. Skin normal without erythema. Pulses normal distally without edema. Sensation in the right leg normal.  X-ray shows arthritis of the right knee  Plan aspiration injection  The aspirate was  negative  Injected cortisone  Start diclofenac 50 mg twice a day  Recheck knee in one month

## 2015-10-31 NOTE — Progress Notes (Signed)
Procedure note right knee injection verbal consent was obtained to inject right knee joint  Timeout was completed to confirm the site of injection  The medications used were 40 mg of Depo-Medrol and 1% lidocaine 3 cc  Anesthesia was provided by ethyl chloride and the skin was prepped with alcohol.  After cleaning the skin with alcohol a 20-gauge needle was used to inject the right knee joint. There were no complications. A sterile bandage was applied.   

## 2015-11-28 ENCOUNTER — Ambulatory Visit (INDEPENDENT_AMBULATORY_CARE_PROVIDER_SITE_OTHER): Payer: BLUE CROSS/BLUE SHIELD | Admitting: Orthopedic Surgery

## 2015-11-28 ENCOUNTER — Encounter: Payer: Self-pay | Admitting: Orthopedic Surgery

## 2015-11-28 VITALS — BP 157/88 | HR 64 | Ht 66.0 in | Wt 231.0 lb

## 2015-11-28 DIAGNOSIS — Z4789 Encounter for other orthopedic aftercare: Secondary | ICD-10-CM | POA: Diagnosis not present

## 2015-11-28 DIAGNOSIS — M1711 Unilateral primary osteoarthritis, right knee: Secondary | ICD-10-CM

## 2015-11-28 DIAGNOSIS — M129 Arthropathy, unspecified: Secondary | ICD-10-CM

## 2015-11-28 NOTE — Progress Notes (Signed)
[name], female   DOB: 21-Apr-1945, 70 y.o.   MRN: [id number]  Chief Complaint  Patient presents with  . Follow-up    RIGHT KNEE S/P INJECTION    HPI Gabriela Wallace is a 70 y.o. female.  Reevaluation right knee status post right knee arthroscopy in April HPI She initially did well until she went back to work and started having pain and swelling we put her on diclofenac and gave her a cortisone injection in the joint she reports she is much better Review of Systems Review of Systems Normal neuro  No catching or locking no swelling Past Medical History:  Diagnosis Date  . Arthritis   . Depression   . Neuropathy ([place])   . Seizures ([place])    last seizure 10 yrs ago. unknown etiology and on no meds now.     Examination BP (!) 157/88   Pulse 64   Ht 5\' 6"  (1.676 m)   Wt 231 lb (104.8 kg)   BMI 37.28 kg/m   Gen. appearance the patient's appearance is normal with normal grooming and hygiene The patient is oriented to person place and time Mood and affect are normal Sensation is  Normal in the right lower extremity  Pulses are   2+ with no edema  Ortho Exam  Range of motion 120 right knee no tenderness or swelling ligaments are stable Medical decision-making  Diagnosis osteoarthritis right knee status post arthroscopy April 24 of 2017  Data  Plan (risk) Stop naproxen continue diclofenac follow-up as needed  Arther Abbott, MD 11/28/2015 10:28 AM

## 2015-11-28 NOTE — Patient Instructions (Signed)
Continue diclofenac stop naproxen

## 2015-12-20 DIAGNOSIS — Z0000 Encounter for general adult medical examination without abnormal findings: Secondary | ICD-10-CM | POA: Diagnosis not present

## 2015-12-20 DIAGNOSIS — E7800 Pure hypercholesterolemia, unspecified: Secondary | ICD-10-CM | POA: Diagnosis not present

## 2015-12-22 DIAGNOSIS — I83892 Varicose veins of left lower extremities with other complications: Secondary | ICD-10-CM | POA: Diagnosis not present

## 2015-12-22 DIAGNOSIS — I83812 Varicose veins of left lower extremities with pain: Secondary | ICD-10-CM | POA: Diagnosis not present

## 2015-12-22 DIAGNOSIS — I8312 Varicose veins of left lower extremity with inflammation: Secondary | ICD-10-CM | POA: Diagnosis not present

## 2015-12-23 DIAGNOSIS — Z1231 Encounter for screening mammogram for malignant neoplasm of breast: Secondary | ICD-10-CM | POA: Diagnosis not present

## 2016-02-02 DIAGNOSIS — J111 Influenza due to unidentified influenza virus with other respiratory manifestations: Secondary | ICD-10-CM | POA: Diagnosis not present

## 2016-02-02 DIAGNOSIS — Z791 Long term (current) use of non-steroidal anti-inflammatories (NSAID): Secondary | ICD-10-CM | POA: Diagnosis not present

## 2016-02-02 DIAGNOSIS — M1990 Unspecified osteoarthritis, unspecified site: Secondary | ICD-10-CM | POA: Diagnosis not present

## 2016-02-02 DIAGNOSIS — M25561 Pain in right knee: Secondary | ICD-10-CM | POA: Diagnosis not present

## 2016-02-03 DIAGNOSIS — M353 Polymyalgia rheumatica: Secondary | ICD-10-CM | POA: Diagnosis not present

## 2016-03-20 ENCOUNTER — Encounter: Payer: Self-pay | Admitting: Adult Health

## 2016-03-20 ENCOUNTER — Ambulatory Visit (INDEPENDENT_AMBULATORY_CARE_PROVIDER_SITE_OTHER): Payer: BLUE CROSS/BLUE SHIELD | Admitting: Adult Health

## 2016-03-20 VITALS — BP 153/84 | HR 71 | Wt 229.2 lb

## 2016-03-20 DIAGNOSIS — G629 Polyneuropathy, unspecified: Secondary | ICD-10-CM

## 2016-03-20 MED ORDER — GABAPENTIN 300 MG PO CAPS: 900.0000 mg | ORAL_CAPSULE | Freq: Every day | ORAL | 11 refills | Status: DC

## 2016-03-20 NOTE — Progress Notes (Signed)
PATIENT: Gabriela Wallace DOB: 10/07/1945  REASON FOR VISIT: follow up- small fiber neuropathy HISTORY FROM: patient  HISTORY OF PRESENT ILLNESS: Gabriela Wallace is a 70 year old female with a history of small fiber neuropathy. She returns today for follow-up. She had blood work that was unremarkable with exception of RPR which was positive. A lumbar puncture was completed that was relatively unremarkable. She is currently on gabapentin 900 mg at bedtime. The patient states that she is unable to tolerate gabapentin during the day as it makes her drowsy. She states that this is working well for her discomfort. She states that she sleeps well at night. Denies any significant pain. She states that the health department did call her but advised that she did not need to be treated for her positive RPR. She returns today for follow-up.   Interval history 09/21/2015: She was never called by ID per patient. 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 Will order a lumbar puncture. Worse at night in bed. Burning and numb toes. neurontin helps at night. During the day she does not need medication it is fine. Daughter here today, discussed neuropathy, we did an extensive panel and only pre-diabetes was found which can cause a small fiber neuropathy. Needs weight loss and diet control. HgbA1c 6.0 all other tests unremarkable exceot for RPR. Discussed lumbar puncture to evaluate for neurosyphilis.   HPI: Gabriela Wallace is a 70 y.o. female here as a referral from Dr. Scotty Court for pain in the feet. PMHx depression, seizures, arthritis. Started 6 months ago. Her feet go numb and she has burning radiates up the front of the lower leg. Worse at night. Started slowly and progressed. Getting worse. It is continuous daily. No inciting events, no falls, no new medications, no previous illnesses. She has cramps in the feet and legs. More than a year of cramps. She has some back pain but denies any radicular symptoms. No FHx of neuropathy. The hands  tingle sometimes. No weakness except she is having arthritis in the kness and that hurts. She has chronic dark spots on the LE, Ongoing for 2 years. Dermatologist gave her some cream. No weakness. No falls. Started in the toes. Denies any back pain or radicular symptoms.   Reviewed notes, labs and imaging from outside physicians, which showed: Reviewed notes from primary care. Patient was seen on 06/22/2015 pain in her right foot and ankle extending to the knee on the anterior aspect of the right lower leg. She reported it was present for several months insidious type of onset. She notices it is worse at night or lying down. She reported the pain as burning. No history in the past. No problems with her back.  MRI of the knee on the right without contrast:  Medial meniscus: There is marked degeneration of the meniscal body and posterior horn. The meniscus is partially extruded from the joint with diffuse degenerative tearing. No clear centrally displaced meniscal fragment identified.  Lateral meniscus: Intact with normal morphology.  LIGAMENTS  Cruciates: Intact with mild ACL mucoid degeneration.  Collaterals: Intact with moderate MCL degeneration and medial bowing.  CARTILAGE  Patellofemoral: Mild patellar chondral thinning and surface irregularity with prominent diffuse patellofemoral osteophytes.  Medial: Advanced osteoarthritis with diffuse chondral thinning, large osteophytes and subchondral edema.  Lateral: Mild chondral thinning, surface irregularity and osteophyte formation.  OTHER  Joint: Small joint effusion with mild synovial irregularity. There is an ossified loose body anteriorly, inferior to the patellofemoral joint, measuring up to 13 mm  on axial image number 18. Possible central loose body as well.  Popliteal Fossa: Unremarkable. No significant Baker's cyst.  Extensor Mechanism: Intact.  Bones: No acute or significant extra-articular osseous  findings.  IMPRESSION: 1. Tricompartmental osteoarthritis, advanced within the medial compartment. Associated intra-articular loose bodies. 2. Associated diffuse degenerative tearing of the posterior horn and body of the medial meniscus. 3. ACL mucoid degeneration.   REVIEW OF SYSTEMS: Out of a complete 14 system review of symptoms, the patient complains only of the following symptoms, and all other reviewed systems are negative.  See history of present illness  ALLERGIES: No Known Allergies  HOME MEDICATIONS: Outpatient Medications Prior to Visit  Medication Sig Dispense Refill  . diclofenac (CATAFLAM) 50 MG tablet Take 1 tablet (50 mg total) by mouth 2 (two) times daily. 60 tablet 1  . naproxen (NAPROSYN) 500 MG tablet Take 1 tablet (500 mg total) by mouth 2 (two) times daily with a meal. 60 tablet 5  . gabapentin (NEURONTIN) 300 MG capsule Take 3 capsules (900 mg total) by mouth at bedtime. 90 capsule 11   No facility-administered medications prior to visit.     PAST MEDICAL HISTORY: Past Medical History:  Diagnosis Date  . Arthritis   . Depression   . Neuropathy ([place])   . Seizures ([place])    last seizure 10 yrs ago. unknown etiology and on no meds now.    PAST SURGICAL HISTORY: Past Surgical History:  Procedure Laterality Date  . ABDOMINAL HYSTERECTOMY    . CATARACT EXTRACTION     right eye-Dr Hunt-[facility]  . CATARACT EXTRACTION W/PHACO Right 06/09/2012   Procedure: CATARACT EXTRACTION PHACO AND INTRAOCULAR LENS PLACEMENT (IOC);  Surgeon: Tonny Branch, MD;  Location: [facility];  Service: Ophthalmology;  Laterality: Right;  CDE=22.34  . KNEE ARTHROSCOPY WITH MEDIAL MENISECTOMY Right 07/27/2015   Procedure: KNEE ARTHROSCOPY WITH MEDIAL MENISECTOMY WITH REMOVAL OF LOOSE BODY;  Surgeon: Carole Civil, MD;  Location: [facility];  Service: Orthopedics;  Laterality: Right;    FAMILY HISTORY: Family History  Problem Relation Age of Onset  . Neuropathy Neg Hx     SOCIAL  HISTORY: Social History   Social History  . Marital status: Widowed    Spouse name: N/A  . Number of children: N/A  . Years of education: 9   Occupational History  . [employer]    Social History Main Topics  . Smoking status: Never Smoker  . Smokeless tobacco: Never Used  . Alcohol use 0.6 oz/week    1 Glasses of wine per week     Comment: occassional  . Drug use: No  . Sexual activity: Yes    Birth control/ protection: Surgical   Other Topics Concern  . Not on file   Social History Narrative   Lives alone   Caffeine use: 2 cups coffee per day      PHYSICAL EXAM  Vitals:   03/20/16 0908  BP: (!) 153/84  Pulse: 71  Weight: 229 lb 3.2 oz (104 kg)   Body mass index is 36.99 kg/m.  Generalized: Well developed, in no acute distress   Neurological examination  Mentation: Alert oriented to time, place, history taking. Follows all commands speech and language fluent Cranial nerve II-XII: Pupils were equal round reactive to light. Extraocular movements were full, visual field were full on confrontational test. Facial sensation and strength were normal. Uvula tongue midline. Head turning and shoulder shrug  were normal and symmetric. Motor: The motor testing reveals 5 over  5 strength of all 4 extremities. Good symmetric motor tone is noted throughout.  Sensory: Sensory testing is intact to soft touch on all 4 extremities. No evidence of extinction is noted.  Coordination: Cerebellar testing reveals good finger-nose-finger and heel-to-shin bilaterally.  Gait and station: Gait is normal. Tandem gait is slightly unsteady. Romberg is negative. No drift is seen.  Reflexes: Deep tendon reflexes are symmetric and normal bilaterally.   DIAGNOSTIC DATA (LABS, IMAGING, TESTING) - I reviewed patient records, labs, notes, testing and imaging myself where available.  Lab Results  Component Value Date   WBC 4.2 06/29/2015   HGB 12.7 06/05/2012   HCT 39.5 06/29/2015   MCV  79 06/29/2015   PLT 264 06/29/2015      Component Value Date/Time   NA 139 06/29/2015 1428   K 4.6 06/29/2015 1428   CL 102 06/29/2015 1428   CO2 21 06/29/2015 1428   GLUCOSE 101 (H) 06/29/2015 1428   GLUCOSE 94 06/05/2012 0853   BUN 14 06/29/2015 1428   CREATININE 0.93 06/29/2015 1428   CALCIUM 9.7 06/29/2015 1428   PROT 7.8 06/29/2015 1428   ALBUMIN 4.3 06/29/2015 1428   AST 19 06/29/2015 1428   ALT 9 06/29/2015 1428   ALKPHOS 86 06/29/2015 1428   BILITOT 0.4 06/29/2015 1428   GFRNONAA 63 06/29/2015 1428   GFRAA 73 06/29/2015 1428    Lab Results  Component Value Date   HGBA1C 6.0 (H) 06/29/2015   Lab Results  Component Value Date   [id number] 06/29/2015   Lab Results  Component Value Date   TSH 1.380 06/29/2015      ASSESSMENT AND PLAN 70 y.o. year old female  has a past medical history of Arthritis; Depression; Neuropathy ([place]); and Seizures ([place]). here with:  1. Small fiber neuropathy 2. Positive RPR  The patient will continue taking gabapentin 900 mg at bedtime. This is controlling her discomfort. Patient advised that if her symptoms worsen or she develops any new symptoms she should let us know. She will follow-up in 6 months or sooner if needed.       Ward Givens, MSN, NP-C 03/20/2016, 9:32 AM [facility] [address] [place], [place] [address] [phone]

## 2016-03-20 NOTE — Patient Instructions (Signed)
Continue gabapentin If your symptoms worsen or you develop new symptoms please let us know.

## 2016-03-24 NOTE — Progress Notes (Signed)
Personally have participated in and made any corrections needed to history, physical, neuro exam,assessment and plan as stated above.  I have personally reviewed the history, evaluated lab date, reviewed imaging studies and agree with radiology interpretations.    Antonia Ahern, MD  

## 2016-06-05 DIAGNOSIS — M545 Low back pain: Secondary | ICD-10-CM | POA: Diagnosis not present

## 2016-06-05 DIAGNOSIS — K219 Gastro-esophageal reflux disease without esophagitis: Secondary | ICD-10-CM | POA: Diagnosis not present

## 2016-06-06 ENCOUNTER — Encounter: Payer: Self-pay | Admitting: Neurology

## 2016-08-15 DIAGNOSIS — M489 Spondylopathy, unspecified: Secondary | ICD-10-CM | POA: Diagnosis not present

## 2016-08-15 DIAGNOSIS — G8929 Other chronic pain: Secondary | ICD-10-CM | POA: Diagnosis not present

## 2016-08-15 DIAGNOSIS — M545 Low back pain: Secondary | ICD-10-CM | POA: Diagnosis not present

## 2016-09-18 ENCOUNTER — Ambulatory Visit: Payer: BLUE CROSS/BLUE SHIELD | Admitting: Neurology

## 2016-09-25 ENCOUNTER — Ambulatory Visit: Payer: BLUE CROSS/BLUE SHIELD | Admitting: Neurology

## 2016-10-10 ENCOUNTER — Ambulatory Visit: Payer: BLUE CROSS/BLUE SHIELD | Admitting: Neurology

## 2016-10-12 ENCOUNTER — Ambulatory Visit: Payer: BLUE CROSS/BLUE SHIELD | Admitting: Neurology

## 2016-10-12 ENCOUNTER — Telehealth: Payer: Self-pay

## 2016-10-12 NOTE — Telephone Encounter (Signed)
Pt called and cancelled/rescheduled same day appt.

## 2016-10-19 IMAGING — XA DG FLUORO GUIDE SPINAL/SI JT INJ*R*
1 series · 1 of 1 positions shown · non-contrast
Comparison: none

CLINICAL DATA: Lower extremity numbness

[Series 1: ortho standard · 1 of 1 slices shown]
[im 1/1]
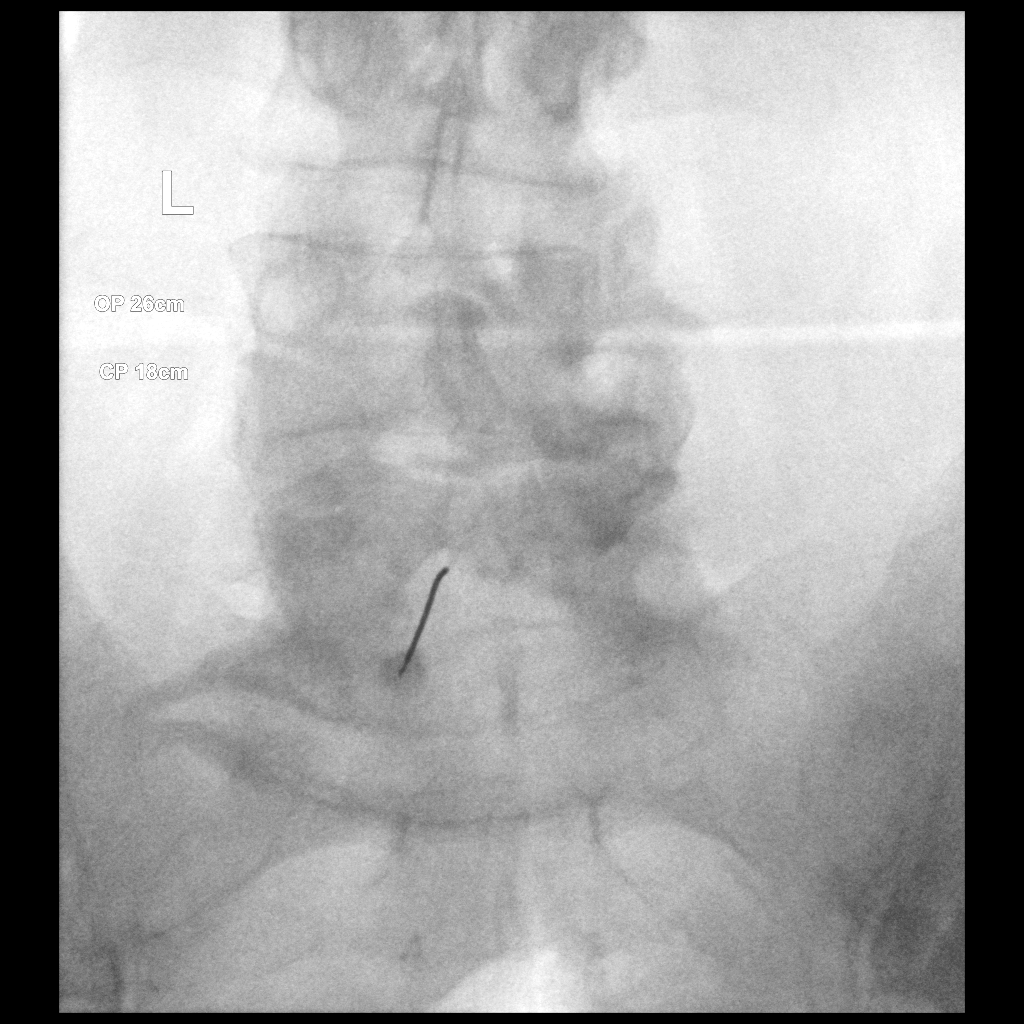

[1 of 1 positions shown; findings below may reference images not displayed]

EXAM:
DIAGNOSTIC LUMBAR PUNCTURE UNDER FLUOROSCOPIC GUIDANCE

FLUOROSCOPY TIME:  9 seconds

PROCEDURE:
Informed consent was obtained from the patient prior to the
procedure, including potential complications of headache, allergy,
and pain. With the patient prone, the lower back was prepped with
Betadine. 1% Lidocaine was used for local anesthesia. Lumbar
puncture was performed at the left L5-S1 level using a 20 gauge
needle with return of clear CSF with an opening pressure of 26 cm
water. Eightml of CSF were obtained for laboratory studies. Closing
pressure was 18 cm water.

COMPLICATIONS:
None
FINDINGS: Imaging documents needle position at L5-S1.
IMPRESSION: Successful lumbar puncture at L5-S1.

## 2016-12-04 ENCOUNTER — Ambulatory Visit: Payer: BLUE CROSS/BLUE SHIELD | Admitting: Neurology

## 2016-12-10 ENCOUNTER — Encounter: Payer: Self-pay | Admitting: Neurology

## 2017-01-24 ENCOUNTER — Encounter: Payer: Self-pay | Admitting: Neurology

## 2017-01-24 ENCOUNTER — Ambulatory Visit (INDEPENDENT_AMBULATORY_CARE_PROVIDER_SITE_OTHER): Payer: BLUE CROSS/BLUE SHIELD | Admitting: Neurology

## 2017-01-24 VITALS — BP 164/82 | HR 70 | Ht 66.0 in | Wt 232.0 lb

## 2017-01-24 DIAGNOSIS — G603 Idiopathic progressive neuropathy: Secondary | ICD-10-CM

## 2017-01-24 NOTE — Progress Notes (Signed)
[facility]    Provider:  Dr Jaynee Eagles Referring Provider: Deloria Lair., MD Primary Care Physician:  Deloria Lair., MD  CC: Pain in the feet  Numbnes sin the feet stable. No more burning. She has to take gabapentin sometimes. moreso at night. No falls.   Interval history 09/21/2015: She was never called by ID per patient. 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 Will order a lumbar puncture. Worse at night in bed. Burning and numb toes. neurontin helps at night. During the day she does not need medication it is fine. Daughter here today, discussed neuropathy, we did an extensive panel and only pre-diabetes was found which can cause a small fiber neuropathy. Needs weight loss and diet control. HgbA1c 6.0 all other tests unremarkable exceot for RPR. Discussed lumbar puncture to evaluate for neurosyphilis.   HPI: Gabriela Wallace is a 71 y.o. female here as a referral from Dr. Scotty Court for pain in the feet. PMHx depression, seizures, arthritis. Started 6 months ago. Her feet go numb and she has burning radiates up the front of the lower leg. Worse at night. Started slowly and progressed. Getting worse. It is continuous daily. No inciting events, no falls, no new medications, no previous illnesses. She has cramps in the feet and legs. More than a year of cramps. She has some back pain but denies any radicular symptoms. No FHx of neuropathy. The hands tingle sometimes. No weakness except she is having arthritis in the kness and that hurts. She has chronic dark spots on the LE, Ongoing for 2 years. Dermatologist gave her some cream. No weakness. No falls. Started in the toes. Denies any back pain or radicular symptoms.   Reviewed notes, labs and imaging from outside physicians, which showed: Reviewed notes from primary care. Patient was seen on 06/22/2015 pain in her right foot and ankle extending to the knee on the anterior aspect of the right lower leg. She reported it was present for several  months insidious type of onset. She notices it is worse at night or lying down. She reported the pain as burning. No history in the past. No problems with her back.  MRI of the knee on the right without contrast:  Medial meniscus: There is marked degeneration of the meniscal body and posterior horn. The meniscus is partially extruded from the joint with diffuse degenerative tearing. No clear centrally displaced meniscal fragment identified.  Lateral meniscus: Intact with normal morphology.  LIGAMENTS  Cruciates: Intact with mild ACL mucoid degeneration.  Collaterals: Intact with moderate MCL degeneration and medial bowing.  CARTILAGE  Patellofemoral: Mild patellar chondral thinning and surface irregularity with prominent diffuse patellofemoral osteophytes.  Medial: Advanced osteoarthritis with diffuse chondral thinning, large osteophytes and subchondral edema.  Lateral: Mild chondral thinning, surface irregularity and osteophyte formation.  OTHER  Joint: Small joint effusion with mild synovial irregularity. There is an ossified loose body anteriorly, inferior to the patellofemoral joint, measuring up to 13 mm on axial image number 18. Possible central loose body as well.  Popliteal Fossa: Unremarkable. No significant Baker's cyst.  Extensor Mechanism: Intact.  Bones: No acute or significant extra-articular osseous findings.  IMPRESSION: 1. Tricompartmental osteoarthritis, advanced within the medial compartment. Associated intra-articular loose bodies. 2. Associated diffuse degenerative tearing of the posterior horn and body of the medial meniscus. 3. ACL mucoid degeneration.  Review of Systems: Patient complains of symptoms per HPI as well as the following symptoms: weight gain, easy bruising, SOB, swelling in legs, SOB, ringing in ears,  joint pain, joint swelling, cramps, aching muscles, headache, numbness, sleepiness, snoring, restless kegs. Pertinent  negatives per HPI. All others negative.   Social History   Social History  . Marital status: Widowed    Spouse name: N/A  . Number of children: N/A  . Years of education: 9   Occupational History  . [employer]    Social History Main Topics  . Smoking status: Never Smoker  . Smokeless tobacco: Never Used  . Alcohol use 0.6 oz/week    1 Glasses of wine per week     Comment: occassional  . Drug use: No  . Sexual activity: Yes    Birth control/ protection: Surgical   Other Topics Concern  . Not on file   Social History Narrative   Lives alone   Caffeine use: 2 cups coffee per day    Family History  Problem Relation Age of Onset  . Neuropathy Neg Hx     Past Medical History:  Diagnosis Date  . Arthritis   . Depression   . Neuropathy   . Seizures ([place])    last seizure 10 yrs ago. unknown etiology and on no meds now.    Past Surgical History:  Procedure Laterality Date  . ABDOMINAL HYSTERECTOMY    . CATARACT EXTRACTION     right eye-Dr Hunt-[facility]  . CATARACT EXTRACTION W/PHACO Right 06/09/2012   Procedure: CATARACT EXTRACTION PHACO AND INTRAOCULAR LENS PLACEMENT (IOC);  Surgeon: Tonny Branch, MD;  Location: [facility];  Service: Ophthalmology;  Laterality: Right;  CDE=22.34  . KNEE ARTHROSCOPY WITH MEDIAL MENISECTOMY Right 07/27/2015   Procedure: KNEE ARTHROSCOPY WITH MEDIAL MENISECTOMY WITH REMOVAL OF LOOSE BODY;  Surgeon: Carole Civil, MD;  Location: [facility];  Service: Orthopedics;  Laterality: Right;    Current Outpatient Prescriptions  Medication Sig Dispense Refill  . celecoxib (CELEBREX) 200 MG capsule Take 200 mg by mouth.    . diclofenac (CATAFLAM) 50 MG tablet Take 1 tablet (50 mg total) by mouth 2 (two) times daily. 60 tablet 1  . esomeprazole (NEXIUM) 20 MG capsule Take 20 mg by mouth.    . gabapentin (NEURONTIN) 300 MG capsule Take 3 capsules (900 mg total) by mouth at bedtime. 90 capsule 11  . naproxen (NAPROSYN) 500 MG tablet Take 1 tablet (500  mg total) by mouth 2 (two) times daily with a meal. 60 tablet 5  . tiZANidine (ZANAFLEX) 4 MG tablet Take 4 mg by mouth.     No current facility-administered medications for this visit.     Allergies as of 01/24/2017  . (No Known Allergies)    Vitals: BP (!) 164/82   Pulse 70   Ht 5\' 6"  (1.676 m)   Wt 232 lb (105.2 kg)   BMI 37.45 kg/m  Last Weight:  Wt Readings from Last 1 Encounters:  01/24/17 232 lb (105.2 kg)   Last Height:   Ht Readings from Last 1 Encounters:  01/24/17 5\' 6"  (1.676 m)       Gen: NAD, conversant, well nourised, obese, well groomed  CV: RRR, no MRG. No Carotid Bruits. +peripheral edema, warm, nontender Eyes: Conjunctivae clear without exudates or hemorrhage Skin: hyperpigmented area in the lower legs below the knees anteriorly and medial achilles. Venous stasis dermatitis?  Neuro: Detailed Neurologic Exam  Speech:  Speech is normal; fluent and spontaneous with normal comprehension.  Cognition:  The patient is oriented to person, place, and time;  Cranial Nerves:  The pupils are equal, round, and reactive to  light.  Visual fields are full to finger confrontation. Extraocular movements are intact. Trigeminal sensation is intact and the muscles of mastication are normal. The face is symmetric. The palate elevates in the midline. Hearing intact. Voice is normal. Shoulder shrug is normal. The tongue has normal motion without fasciculations.   Motor Observation:  No asymmetry, no atrophy, and no involuntary movements noted. Tone:  Normal muscle tone.   Posture:  Posture is normal. normal erect   Strength:  Strength is V/V in the upper and lower limbs.    Sensation: intact to LT. Hyperesthesias and allodynia. Distal decrease to pin prick and temperature. Intact vibration and proprioception.    Reflex Exam:  DTR's: Absent AJs     Assessment/Plan: 71 y.o. female here as a referral from  Dr. Scotty Court for pain in the feet. PMHx depression, seizures, arthritis.  More small fiber involvement on exam.  -complete serum neuropathy panel was unremarkable except +RPR and HgbA1c 6.0. Elevated glucose can cause peripheral small-fiber neuropathy. She needs weight loss and tight glucose control.  - RPR was positive and lumbar puncture was negative. Likely false positive or remote infection. - patient has hyperpigmented areas below the knees. Follows with dermatology. - Continue gabapentin at night.  - Needs to go back to dermatology, she was dxed with PVD, also needs follow-up with primary care for this - Neuropathy stable, we'll release back to primary care follow-up as needed.   Sarina Ill, MD  [facility] [address] [place], [place] [address]  Phone [phone] Fax [phone]  A total of 15 minutes was spent face-to-face with this patient. Over half this time was spent on counseling patient on the peripheral neuropathy, peripheral vascular disease, RPR positive diagnosis and different diagnostic and therapeutic options available.

## 2018-02-10 ENCOUNTER — Ambulatory Visit (INDEPENDENT_AMBULATORY_CARE_PROVIDER_SITE_OTHER): Payer: BLUE CROSS/BLUE SHIELD | Admitting: Orthopedic Surgery

## 2018-02-10 ENCOUNTER — Ambulatory Visit (INDEPENDENT_AMBULATORY_CARE_PROVIDER_SITE_OTHER): Payer: BLUE CROSS/BLUE SHIELD

## 2018-02-10 ENCOUNTER — Encounter: Payer: Self-pay | Admitting: Orthopedic Surgery

## 2018-02-10 VITALS — BP 155/80 | HR 69 | Ht 66.0 in | Wt 235.0 lb

## 2018-02-10 DIAGNOSIS — M545 Low back pain, unspecified: Secondary | ICD-10-CM

## 2018-02-10 NOTE — Patient Instructions (Addendum)
Therapy will be arranged in ED  Follow-up will be in 8 weeks here in the office   2 Advil every 6 hours as needed for pain continue Zanaflex as needed use heating pad as needed      Back Pain, Adult Back pain is very common. The pain often gets better over time. The cause of back pain is usually not dangerous. Most people can learn to manage their back pain on their own. Follow these instructions at home: Watch your back pain for any changes. The following actions may help to lessen any pain you are feeling:  Stay active. Start with short walks on flat ground if you can. Try to walk farther each day.  Exercise regularly as told by your doctor. Exercise helps your back heal faster. It also helps avoid future injury by keeping your muscles strong and flexible.  Do not sit, drive, or stand in one place for more than 30 minutes.  Do not stay in bed. Resting more than 1-2 days can slow down your recovery.  Be careful when you bend or lift an object. Use good form when lifting: ? Bend at your knees. ? Keep the object close to your body. ? Do not twist.  Sleep on a firm mattress. Lie on your side, and bend your knees. If you lie on your back, put a pillow under your knees.  Take medicines only as told by your doctor.  Put ice on the injured area. ? Put ice in a plastic bag. ? Place a towel between your skin and the bag. ? Leave the ice on for 20 minutes, 2-3 times a day for the first 2-3 days. After that, you can switch between ice and heat packs.  Avoid feeling anxious or stressed. Find good ways to deal with stress, such as exercise.  Maintain a healthy weight. Extra weight puts stress on your back.  Contact a doctor if:  You have pain that does not go away with rest or medicine.  You have worsening pain that goes down into your legs or buttocks.  You have pain that does not get better in one week.  You have pain at night.  You lose weight.  You have a fever or  chills. Get help right away if:  You cannot control when you poop (bowel movement) or pee (urinate).  Your arms or legs feel weak.  Your arms or legs lose feeling (numbness).  You feel sick to your stomach (nauseous) or throw up (vomit).  You have belly (abdominal) pain.  You feel like you may pass out (faint). This information is not intended to replace advice given to you by your health care provider. Make sure you discuss any questions you have with your health care provider. Document Released: 09/05/2007 Document Revised: 08/25/2015 Document Reviewed: 07/21/2013 Elsevier Interactive Patient Education  [employer].

## 2018-02-10 NOTE — Progress Notes (Signed)
NEW PROBLEM  OFFICE VISIT  Chief Complaint  Patient presents with  . Back Pain    Low back pain for 1 month, no injury.    72 year old female presents for evaluation of 1 month history of lower back pain without radiation  She reports no trauma complains of left-sided back pain appears to be positional.  No history of prior back pain.  Treatment to the to date includes a heating pad to Advil as needed and Zanaflex  Quality of pain is dull severity is mild to moderate   Review of Systems  Constitutional: Negative for chills, fever, malaise/fatigue and weight loss.  Neurological: Negative for tingling, tremors, sensory change, focal weakness and weakness.     Past Medical History:  Diagnosis Date  . Arthritis   . Depression   . Neuropathy   . Seizures ([place])    last seizure 10 yrs ago. unknown etiology and on no meds now.    Past Surgical History:  Procedure Laterality Date  . ABDOMINAL HYSTERECTOMY    . CATARACT EXTRACTION     right eye-Dr Hunt-[facility]  . CATARACT EXTRACTION W/PHACO Right 06/09/2012   Procedure: CATARACT EXTRACTION PHACO AND INTRAOCULAR LENS PLACEMENT (IOC);  Surgeon: Tonny Branch, MD;  Location: [facility];  Service: Ophthalmology;  Laterality: Right;  CDE=22.34  . KNEE ARTHROSCOPY WITH MEDIAL MENISECTOMY Right 07/27/2015   Procedure: KNEE ARTHROSCOPY WITH MEDIAL MENISECTOMY WITH REMOVAL OF LOOSE BODY;  Surgeon: Carole Civil, MD;  Location: [facility];  Service: Orthopedics;  Laterality: Right;    Family History  Problem Relation Age of Onset  . Cancer Sister   . Heart disease Maternal Grandmother   . Neuropathy Neg Hx    Social History   Tobacco Use  . Smoking status: Never Smoker  . Smokeless tobacco: Never Used  Substance Use Topics  . Alcohol use: Yes    Alcohol/week: 1.0 standard drinks    Types: 1 Glasses of wine per week    Comment: occassional  . Drug use: No    No Known Allergies  No outpatient medications have been marked as taking for  the 02/10/18 encounter (Office Visit) with Carole Civil, MD.    BP (!) 155/80   Pulse 69   Ht 5\' 6"  (1.676 m)   Wt 235 lb (106.6 kg)   BMI 37.93 kg/m   Physical Exam  Constitutional: She is oriented to person, place, and time. She appears well-developed and well-nourished.  Musculoskeletal:       Back:       Legs:      Feet:  Neurological: She is alert and oriented to person, place, and time. No sensory deficit. She exhibits normal muscle tone. Coordination normal.  Skin: Skin is warm and dry. Capillary refill takes less than 2 seconds. No rash noted.  Psychiatric: She has a normal mood and affect. Her behavior is normal. Judgment and thought content normal.  Vitals reviewed.   Ortho Exam  Reflexes 2+ and equal at the knee and ankle bilaterally with normal strength in the quadriceps and hamstrings in both lower extremities her gait does not seem antalgic although a little slow with short stride length.  MEDICAL DECISION SECTION  Xrays were done at [facility] orthopedics  My independent reading of xrays:  See my report but she has severe lumbar spondylosis and arthritis lower back with L5-S1 disc space narrowing L4-5 disc space narrowing L2-S1 facet arthrosis severe  Encounter Diagnosis  Name Primary?  . Acute midline low back  pain without sciatica Yes    PLAN: (Rx., injectx, surgery, frx, mri/ct) Recommend continue ibuprofen Recommend continued Zanaflex Start physical therapy and eating 8 weeks follow-up  No orders of the defined types were placed in this encounter.   Arther Abbott, MD  02/10/2018 9:34 AM

## 2018-02-17 ENCOUNTER — Telehealth (HOSPITAL_COMMUNITY): Payer: Self-pay | Admitting: Physical Therapy

## 2018-02-17 ENCOUNTER — Ambulatory Visit: Payer: BLUE CROSS/BLUE SHIELD | Admitting: Orthopedic Surgery

## 2018-02-17 NOTE — Telephone Encounter (Signed)
Pt called she wants [place] to call her in about 14 days when we get Dr. Ruthe Mannan referral. Her daughter has had a stroke and she will be taking care of her. The patient's referral is not in the workque yet. NF 02/17/18

## 2018-04-07 ENCOUNTER — Ambulatory Visit: Payer: BLUE CROSS/BLUE SHIELD | Admitting: Orthopedic Surgery

## 2019-05-14 ENCOUNTER — Ambulatory Visit: Payer: Medicare Other | Attending: Internal Medicine

## 2019-05-14 DIAGNOSIS — Z23 Encounter for immunization: Secondary | ICD-10-CM | POA: Insufficient documentation

## 2019-05-14 NOTE — Progress Notes (Signed)
   Covid-19 Vaccination Clinic  Name:  Gabriela Wallace    MRN: [id number] DOB: [birth year]  05/14/2019  Gabriela Wallace was observed post Covid-19 immunization for 15 minutes without incidence. She was provided with Vaccine Information Sheet and instruction to access the V-Safe system.   Gabriela Wallace was instructed to call 911 with any severe reactions post vaccine: Marland Kitchen Difficulty breathing  . Swelling of your face and throat  . A fast heartbeat  . A bad rash all over your body  . Dizziness and weakness    Immunizations Administered    Name Date Dose VIS Date Route   Moderna COVID-19 Vaccine 05/14/2019  1:03 PM 0.5 mL 03/03/2019 Intramuscular   Manufacturer: Moderna   Lot: [id number]   [id number]

## 2019-06-15 ENCOUNTER — Ambulatory Visit: Payer: Medicare Other | Attending: Internal Medicine

## 2019-06-15 DIAGNOSIS — Z23 Encounter for immunization: Secondary | ICD-10-CM

## 2019-06-15 NOTE — Progress Notes (Signed)
   Covid-19 Vaccination Clinic  Name:  Gabriela Wallace    MRN: [id number] DOB: Aug 10, 1945  06/15/2019  Gabriela Wallace was observed post Covid-19 immunization for 15 minutes without incident. She was provided with Vaccine Information Sheet and instruction to access the V-Safe system.   Gabriela Wallace was instructed to call 911 with any severe reactions post vaccine: Marland Kitchen Difficulty breathing  . Swelling of face and throat  . A fast heartbeat  . A bad rash all over body  . Dizziness and weakness   Immunizations Administered    Name Date Dose VIS Date Route   Moderna COVID-19 Vaccine 06/15/2019 12:24 PM 0.5 mL 03/03/2019 Intramuscular   Manufacturer: Moderna   Lot: [id number]   [place][id number]

## 2021-08-15 ENCOUNTER — Other Ambulatory Visit: Payer: Self-pay | Admitting: Urology

## 2021-08-15 NOTE — Progress Notes (Signed)
Called requesting order in epic from surgeon. ? ? 08/15/21 0825  ?Preop Orders  ?Has preop orders? No  ?Name of staff/physician contacted for orders(Indicate phone or IB message) Mabe, Coni  ? ? ?

## 2021-08-21 NOTE — Patient Instructions (Signed)
DUE TO COVID-19 ONLY TWO VISITORS  (aged 76 and older)  ARE ALLOWED TO COME WITH YOU AND STAY IN THE WAITING ROOM ONLY DURING PRE OP AND PROCEDURE.   **NO VISITORS ARE ALLOWED IN THE SHORT STAY AREA OR RECOVERY ROOM!!**  IF YOU WILL BE ADMITTED INTO THE HOSPITAL YOU ARE ALLOWED ONLY FOUR SUPPORT PEOPLE DURING VISITATION HOURS ONLY (7 AM -8PM)   The support person(s) must pass our screening, gel in and out, and wear a mask at all times, including in the patient's room. Patients must also wear a mask when staff or their support person are in the room. Visitors GUEST BADGE MUST BE WORN VISIBLY  One adult visitor may remain with you overnight and MUST be in the room by 8 P.M.     Your procedure is scheduled on: 08/31/21   Report to [facility] Main Entrance    Report to admitting at: 1:00 PM   Call this number if you have problems the morning of surgery [phone]   Do not eat food :After Midnight.   After Midnight you may have the following liquids until: 12:00 PM DAY OF SURGERY  Water Black Coffee (sugar ok, NO MILK/CREAM OR CREAMERS)  Tea (sugar ok, NO MILK/CREAM OR CREAMERS) regular and decaf                             Plain Jell-O (NO RED)                                           Fruit ices (not with fruit pulp, NO RED)                                     Popsicles (NO RED)                                                                  Juice: apple, WHITE grape, WHITE cranberry Sports drinks like Gatorade (NO RED) Clear broth(vegetable,chicken,beef)  FOLLOW BOWEL PREP AND ANY ADDITIONAL PRE OP INSTRUCTIONS YOU RECEIVED FROM YOUR SURGEON'S OFFICE!!!   Oral Hygiene is also important to reduce your risk of infection.                                    Remember - BRUSH YOUR TEETH THE MORNING OF SURGERY WITH YOUR REGULAR TOOTHPASTE   Do NOT smoke after Midnight   Take these medicines the morning of surgery with A SIP OF WATER: citalopram,pantoprazole.Tylenol as  needed.  DO NOT TAKE ANY ORAL DIABETIC MEDICATIONS DAY OF YOUR SURGERY  Bring CPAP mask and tubing day of surgery.                              You may not have any metal on your body including hair pins, jewelry, and body piercing  Do not wear make-up, lotions, powders, perfumes/cologne, or deodorant  Do not wear nail polish including gel and S&S, artificial/acrylic nails, or any other type of covering on natural nails including finger and toenails. If you have artificial nails, gel coating, etc. that needs to be removed by a nail salon please have this removed prior to surgery or surgery may need to be canceled/ delayed if the surgeon/ anesthesia feels like they are unable to be safely monitored.   Do not shave  48 hours prior to surgery.    Do not bring valuables to the hospital. [place].   Contacts, dentures or bridgework may not be worn into surgery.   Bring small overnight bag day of surgery.    Patients discharged on the day of surgery will not be allowed to drive home.  Someone NEEDS to stay with you for the first 24 hours after anesthesia.   Special Instructions: Bring a copy of your healthcare power of attorney and living will documents         the day of surgery if you haven't scanned them before.              Please read over the following fact sheets you were given: IF YOU HAVE QUESTIONS ABOUT YOUR PRE-OP INSTRUCTIONS PLEASE CALL [phone]     [facility] - Preparing for Surgery Before surgery, you can play an important role.  Because skin is not sterile, your skin needs to be as free of germs as possible.  You can reduce the number of germs on your skin by washing with CHG (chlorahexidine gluconate) soap before surgery.  CHG is an antiseptic cleaner which kills germs and bonds with the skin to continue killing germs even after washing. Please DO NOT use if you have an allergy to CHG or antibacterial soaps.  If  your skin becomes reddened/irritated stop using the CHG and inform your nurse when you arrive at Short Stay. Do not shave (including legs and underarms) for at least 48 hours prior to the first CHG shower.  You may shave your face/neck. Please follow these instructions carefully:  1.  Shower with CHG Soap the night before surgery and the  morning of Surgery.  2.  If you choose to wash your hair, wash your hair first as usual with your  normal  shampoo.  3.  After you shampoo, rinse your hair and body thoroughly to remove the  shampoo.                           4.  Use CHG as you would any other liquid soap.  You can apply chg directly  to the skin and wash                       Gently with a scrungie or clean washcloth.  5.  Apply the CHG Soap to your body ONLY FROM THE NECK DOWN.   Do not use on face/ open                           Wound or open sores. Avoid contact with eyes, ears mouth and genitals (private parts).  Wash face,  Genitals (private parts) with your normal soap.             6.  Wash thoroughly, paying special attention to the area where your surgery  will be performed.  7.  Thoroughly rinse your body with warm water from the neck down.  8.  DO NOT shower/wash with your normal soap after using and rinsing off  the CHG Soap.                9.  Pat yourself dry with a clean towel.            10.  Wear clean pajamas.            11.  Place clean sheets on your bed the night of your first shower and do not  sleep with pets. Day of Surgery : Do not apply any lotions/deodorants the morning of surgery.  Please wear clean clothes to the hospital/surgery center.  FAILURE TO FOLLOW THESE INSTRUCTIONS MAY RESULT IN THE CANCELLATION OF YOUR SURGERY PATIENT SIGNATURE_________________________________  NURSE SIGNATURE__________________________________  ________________________________________________________________________

## 2021-08-22 ENCOUNTER — Encounter (HOSPITAL_COMMUNITY): Payer: Self-pay

## 2021-08-22 ENCOUNTER — Encounter (HOSPITAL_COMMUNITY)
Admission: RE | Admit: 2021-08-22 | Discharge: 2021-08-22 | Disposition: A | Discharge status: Home / Self Care | Payer: Medicare HMO | Source: Ambulatory Visit | Attending: Urology | Admitting: Urology

## 2021-08-22 ENCOUNTER — Other Ambulatory Visit: Payer: Self-pay

## 2021-08-22 VITALS — BP 118/93 | HR 71 | Temp 98.0°F

## 2021-08-22 DIAGNOSIS — Z01818 Encounter for other preprocedural examination: Secondary | ICD-10-CM

## 2021-08-22 DIAGNOSIS — I2510 Atherosclerotic heart disease of native coronary artery without angina pectoris: Secondary | ICD-10-CM | POA: Insufficient documentation

## 2021-08-22 DIAGNOSIS — Z01812 Encounter for preprocedural laboratory examination: Secondary | ICD-10-CM | POA: Diagnosis present

## 2021-08-22 HISTORY — DX: Essential (primary) hypertension: I10

## 2021-08-22 HISTORY — DX: Anxiety disorder, unspecified: F41.9

## 2021-08-22 LAB — CBC
HCT: 39.9 % (ref 36.0–46.0)
Hemoglobin: 13.1 g/dL (ref 12.0–15.0)
MCH: 27.3 pg (ref 26.0–34.0)
MCHC: 32.8 g/dL (ref 30.0–36.0)
MCV: 83.1 fL (ref 80.0–100.0)
Platelets: 263 10*3/uL (ref 150–400)
RBC: 4.8 MIL/uL (ref 3.87–5.11)
RDW: 13.7 % (ref 11.5–15.5)
WBC: 5.3 10*3/uL (ref 4.0–10.5)
nRBC: 0 % (ref 0.0–0.2)

## 2021-08-22 LAB — BASIC METABOLIC PANEL
Anion gap: 7 (ref 5–15)
BUN: 11 mg/dL (ref 8–23)
CO2: 26 mmol/L (ref 22–32)
Calcium: 9.4 mg/dL (ref 8.9–10.3)
Chloride: 105 mmol/L (ref 98–111)
Creatinine, Ser: 1.01 mg/dL — ABNORMAL HIGH (ref 0.44–1.00)
GFR, Estimated: 58 mL/min — ABNORMAL LOW (ref >60)
Glucose, Bld: 107 mg/dL — ABNORMAL HIGH (ref 70–99)
Potassium: 4.2 mmol/L (ref 3.5–5.1)
Sodium: 138 mmol/L (ref 135–145)

## 2021-08-22 NOTE — Progress Notes (Signed)
For Short Stay: [place] appointment date: Date of COVID positive in last 24 days:  Bowel Prep reminder:   For Anesthesia: PCP - Dr. Catalina Antigua: Family Medicine on [facility] Cardiologist - N/A  Chest x-ray -  EKG - 05/23/21: chart Stress Test -  ECHO -  Cardiac Cath -  Pacemaker/ICD device last checked: Pacemaker orders received: Device Rep notified:  Spinal Cord Stimulator:  Sleep Study -  CPAP -   Fasting Blood Sugar -  Checks Blood Sugar _____ times a day Date and result of last Hgb A1c-  Blood Thinner Instructions: Aspirin Instructions: Last Dose:  Activity level: Can go up a flight of stairs and activities of daily living without stopping and without chest pain and/or shortness of breath   Able to exercise without chest pain and/or shortness of breath   Unable to go up a flight of stairs without chest pain and/or shortness of breath     Anesthesia review: Hx: HTN<Seizures(last one 20 years ago)  Patient denies shortness of breath, fever, cough and chest pain at PAT appointment   Patient verbalized understanding of instructions that were given to them at the PAT appointment. Patient was also instructed that they will need to review over the PAT instructions again at home before surgery.

## 2021-08-30 NOTE — H&P (Signed)
Left renal mass   Gabriela Wallace returns today for further evaluation after having had an MRI with an indeterminate 2.2 cm centrally located left renal mass on an MRI in January. She presents today after undergoing a CT scan of the abdomen with and without contrast. She denies any hematuria or left-sided flank pain since her last visit.     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   May 2023: CT with and without contrast    NON-GU PMH: Arthritis Depression GERD Hypercholesterolemia Hypertension    FAMILY HISTORY: 1 Daughter - Runs in Family 5 sons - Runs in Family   SOCIAL HISTORY: Marital Status: Widowed Preferred Language: English; Ethnicity: Not Hispanic Or Latino; Race: Black or African American Current Smoking Status: Patient has never smoked.   Tobacco Use Assessment Completed: Used Tobacco in last 30 days? Has never drank.  Drinks 3 caffeinated drinks per day.    REVIEW OF SYSTEMS:    GU Review Female:   Patient denies frequent urination, hard to postpone urination, burning /pain with urination, get up at night to urinate, leakage of urine, stream starts and stops, trouble starting your stream, have to strain to urinate, and currently pregnant.  Gastrointestinal (Lower):   Patient denies diarrhea and constipation.  Gastrointestinal (Upper):   Patient denies nausea and vomiting.  Constitutional:   Patient denies fever, night sweats, weight loss, and fatigue.  Skin:   Patient denies skin rash/ lesion and itching.  Eyes:   Patient denies blurred vision and  double vision.  Ears/ Nose/ Throat:   Patient denies sore throat and sinus problems.  Hematologic/Lymphatic:   Patient denies swollen glands and easy bruising.  Cardiovascular:   Patient denies leg swelling and chest pains.  Respiratory:   Patient denies cough and shortness of breath.  Endocrine:   Patient denies excessive thirst.  Musculoskeletal:   Patient denies back pain and joint pain.  Neurological:   Patient denies headaches and dizziness.  Psychologic:   Patient denies depression and anxiety.   VITAL SIGNS:     Weight 220 lb / 99.79 kg  BMI 35.5 kg/m   MULTI-SYSTEM PHYSICAL EXAMINATION:    Constitutional: Well-nourished. No physical deformities. Normally developed. Good grooming.  Respiratory: No labored breathing, no use of accessory muscles. Clear bilaterally.  Cardiovascular: Normal temperature, normal extremity pulses, no swelling, no varicosities. Regular rate and rhythm.     Complexity of Data:  Records Review:   Previous Patient Records  X-Ray Review: C.T. Abdomen/Pelvis: Reviewed Films.    Notes:                     CLINICAL DATA: Left renal abnormality on previous MR and CT.   EXAM:  CT ABDOMEN AND PELVIS WITHOUT AND WITH CONTRAST   TECHNIQUE:  Multidetector CT imaging of the abdomen and pelvis was performed  following the standard protocol before and following the bolus  administration of intravenous contrast.   RADIATION DOSE REDUCTION: This  exam was performed according to the  departmental dose-optimization program which includes automated  exposure control, adjustment of the mA and/or kV according to  patient size and/or use of iterative reconstruction technique.   CONTRAST: 100 cc Omnipaque 350   COMPARISON: 03/30/2021 CT. 04/07/2021 MRI.   FINDINGS:  Lower chest: Unremarkable.   Hepatobiliary: No suspicious focal abnormality within the liver  parenchyma. There is no evidence for gallstones, gallbladder wall  thickening, or pericholecystic  fluid. No intrahepatic or  extrahepatic biliary dilation.   Pancreas: No focal mass lesion. No dilatation of the main duct. No  intraparenchymal cyst. No peripancreatic edema.   Spleen: No splenomegaly. No focal mass lesion.   Adrenals/Urinary Tract: No adrenal nodule or mass. Pre contrast  imaging shows no stones in either kidney.   Postcontrast imaging shows duplicated right intrarenal collecting  system but otherwise unremarkable right kidney.   As noted on prior imaging, there is a very subtle masslike lesion in  the anterior interpolar left kidney. This is quite subtle on pre and  postcontrast imaging but is progressive since a CT scan of  09/18/2020. Lesion shows subtly increased attenuation compared to  background kidney parenchyma on precontrast imaging (axial 50/series  13) with relatively diffuse homogeneous enhancement after IV  contrast administration (50/series 2) and washout compared to  background renal parenchyma on delayed imaging (48/series 12)..  Comparing on delayed imaging, the lesion was 2.2 x 2.1 cm on image  21 of series 7 previously (09/18/2020 exam delayed sequence) which  compares to 2.7 x 2.8 cm today on delayed image 47 of series 2. As  on the prior study, there are several foci of delayed enhancement or  contrast excretion in the anterior aspect of the lesion (44/12  today).   Left ureter unremarkable. The urinary bladder appears normal for the  degree of distention.   Stomach/Bowel: Stomach is moderately distended with fluid. Duodenum  is normally positioned as is the ligament of Treitz. No small bowel  wall thickening. No small bowel dilatation. The terminal ileum is  normal. The appendix is normal. No gross colonic mass. No colonic  wall thickening.   Vascular/Lymphatic: There is mild atherosclerotic calcification of  the abdominal aorta without aneurysm. There is no gastrohepatic or  hepatoduodenal ligament lymphadenopathy. No retroperitoneal  or  mesenteric lymphadenopathy. No pelvic sidewall lymphadenopathy.   Reproductive: The uterus is unremarkable. There is no adnexal mass.   Other: No intraperitoneal free fluid.   Musculoskeletal: No worrisome lytic or sclerotic osseous  abnormality.   IMPRESSION:  1. Very subtle mass lesion in the anterior interpolar left kidney  measuring up to 2.8 cm on today's study (increased from 2.2 cm on  09/18/2020). Imaging features are increasingly suspicious for  neoplasm, including papillary renal cell carcinoma.  2. Duplicated right intrarenal collecting system.  3. Aortic Atherosclerosis (ICD10-I70.0).    Electronically Signed  By: Misty Stanley M.D.  On: 07/24/2021 11:33       ASSESSMENT:      ICD-10 Details  1 GU:   Left renal neoplasm - D49.512    PLAN:      1. Left renal mass: I am more concerned that this mass that is centrally located in the upper portion of her left kidney is a well-defined an enhancing mass based on her CT scan. This is centrally located and I have recommended that we proceed with further evaluation including cystoscopy, left retrograde pyelography, possible left ureteroscopy with biopsy and possible left ureteral stent. We have  reviewed this procedure in detail including the potential risks and the recovery process. If this is nondiagnostic and rules out urothelial carcinoma, I have recommended that she proceed with a percutaneous biopsy subsequently to determine whether this lesion is malignant or not as surgical treatment would likely require a total nephrectomy if this is renal cell carcinoma considering the central location of the tumor. She will proceed as discussed with further recommendations pending the results of upcoming evaluation/biopsy.

## 2021-08-31 ENCOUNTER — Ambulatory Visit (HOSPITAL_COMMUNITY): Payer: Medicare HMO

## 2021-08-31 ENCOUNTER — Encounter (HOSPITAL_COMMUNITY): Payer: Self-pay | Admitting: Urology

## 2021-08-31 ENCOUNTER — Ambulatory Visit (HOSPITAL_BASED_OUTPATIENT_CLINIC_OR_DEPARTMENT_OTHER): Payer: Medicare HMO | Admitting: Certified Registered"

## 2021-08-31 ENCOUNTER — Encounter (HOSPITAL_COMMUNITY)
Admission: RE | Disposition: A | Discharge status: Home / Self Care | Payer: Self-pay | Source: Home / Self Care | Attending: Urology

## 2021-08-31 ENCOUNTER — Ambulatory Visit (HOSPITAL_COMMUNITY)
Admission: RE | Admit: 2021-08-31 | Discharge: 2021-08-31 | Disposition: A | Discharge status: Home / Self Care | Payer: Medicare HMO | Attending: Urology | Admitting: Urology

## 2021-08-31 ENCOUNTER — Ambulatory Visit (HOSPITAL_COMMUNITY): Payer: Medicare HMO | Admitting: Certified Registered"

## 2021-08-31 DIAGNOSIS — I10 Essential (primary) hypertension: Secondary | ICD-10-CM

## 2021-08-31 DIAGNOSIS — F418 Other specified anxiety disorders: Secondary | ICD-10-CM | POA: Diagnosis not present

## 2021-08-31 DIAGNOSIS — M1990 Unspecified osteoarthritis, unspecified site: Secondary | ICD-10-CM

## 2021-08-31 DIAGNOSIS — N2889 Other specified disorders of kidney and ureter: Secondary | ICD-10-CM | POA: Diagnosis present

## 2021-08-31 HISTORY — PX: CYSTOSCOPY WITH URETEROSCOPY AND STENT PLACEMENT: SHX6377

## 2021-08-31 SURGERY — CYSTOURETEROSCOPY, WITH STENT INSERTION
Anesthesia: General | Laterality: Left

## 2021-08-31 MED ORDER — ACETAMINOPHEN 10 MG/ML IV SOLN: 1000.0000 mg | Freq: Once | INTRAVENOUS | Status: DC | PRN

## 2021-08-31 MED ORDER — LIDOCAINE HCL (PF) 2 % IJ SOLN
INTRAMUSCULAR | Status: AC
Start: 2021-08-31 — End: ?
  Filled 2021-08-31: qty 5

## 2021-08-31 MED ORDER — CEFAZOLIN SODIUM-DEXTROSE 2-4 GM/100ML-% IV SOLN
2.0000 g | Freq: Once | INTRAVENOUS | Status: AC
  Administered 2021-08-31: 2 g via INTRAVENOUS
  Filled 2021-08-31: qty 100

## 2021-08-31 MED ORDER — FENTANYL CITRATE (PF) 100 MCG/2ML IJ SOLN
INTRAMUSCULAR | Status: AC
  Filled 2021-08-31: qty 2

## 2021-08-31 MED ORDER — FENTANYL CITRATE PF 50 MCG/ML IJ SOSY: 25.0000 ug | PREFILLED_SYRINGE | INTRAMUSCULAR | Status: DC | PRN

## 2021-08-31 MED ORDER — LACTATED RINGERS IV SOLN: INTRAVENOUS | Status: DC

## 2021-08-31 MED ORDER — IOHEXOL 300 MG/ML  SOLN
INTRAMUSCULAR | Status: DC | PRN
  Administered 2021-08-31: 20 mL

## 2021-08-31 MED ORDER — DEXAMETHASONE SODIUM PHOSPHATE 10 MG/ML IJ SOLN
INTRAMUSCULAR | Status: DC | PRN
  Administered 2021-08-31: 10 mg via INTRAVENOUS

## 2021-08-31 MED ORDER — ONDANSETRON HCL 4 MG/2ML IJ SOLN
INTRAMUSCULAR | Status: DC | PRN
  Administered 2021-08-31: 4 mg via INTRAVENOUS

## 2021-08-31 MED ORDER — FENTANYL CITRATE (PF) 100 MCG/2ML IJ SOLN
INTRAMUSCULAR | Status: DC | PRN
Start: 2021-08-31 — End: 2021-08-31
  Administered 2021-08-31: 25 ug via INTRAVENOUS
  Administered 2021-08-31: 50 ug via INTRAVENOUS

## 2021-08-31 MED ORDER — DEXAMETHASONE SODIUM PHOSPHATE 10 MG/ML IJ SOLN
INTRAMUSCULAR | Status: AC
  Filled 2021-08-31: qty 1

## 2021-08-31 MED ORDER — ONDANSETRON HCL 4 MG/2ML IJ SOLN
INTRAMUSCULAR | Status: AC
Start: 2021-08-31 — End: ?
  Filled 2021-08-31: qty 2

## 2021-08-31 MED ORDER — ORAL CARE MOUTH RINSE: 15.0000 mL | Freq: Once | OROMUCOSAL | Status: AC

## 2021-08-31 MED ORDER — PROPOFOL 10 MG/ML IV BOLUS
INTRAVENOUS | Status: DC | PRN
  Administered 2021-08-31: 160 mg via INTRAVENOUS

## 2021-08-31 MED ORDER — LIDOCAINE 2% (20 MG/ML) 5 ML SYRINGE
INTRAMUSCULAR | Status: DC | PRN
  Administered 2021-08-31: 80 mg via INTRAVENOUS

## 2021-08-31 MED ORDER — CHLORHEXIDINE GLUCONATE 0.12 % MT SOLN
15.0000 mL | Freq: Once | OROMUCOSAL | Status: AC
Start: 2021-08-31 — End: 2021-08-31
  Administered 2021-08-31: 15 mL via OROMUCOSAL

## 2021-08-31 SURGICAL SUPPLY — 29 items
BAG COUNTER SPONGE SURGICOUNT (BAG) IMPLANT
BAG URINE DRAIN 2000ML AR STRL (UROLOGICAL SUPPLIES) IMPLANT
BAG URO CATCHER STRL LF (MISCELLANEOUS) ×2 IMPLANT
BASKET ZERO TIP NITINOL 2.4FR (BASKET) IMPLANT
CATH URETL OPEN END 6FR 70 (CATHETERS) IMPLANT
CLOTH BEACON ORANGE TIMEOUT ST (SAFETY) ×2 IMPLANT
DRAPE FOOT SWITCH (DRAPES) ×2 IMPLANT
ELECT REM PT RETURN 15FT ADLT (MISCELLANEOUS) ×2 IMPLANT
GLOVE SURG LX 7.5 STRW (GLOVE) ×1
GLOVE SURG LX STRL 7.5 STRW (GLOVE) ×1 IMPLANT
GOWN STRL REUS W/ TWL XL LVL3 (GOWN DISPOSABLE) ×1 IMPLANT
GOWN STRL REUS W/TWL XL LVL3 (GOWN DISPOSABLE) ×2
GUIDEWIRE STR DUAL SENSOR (WIRE) ×2 IMPLANT
GUIDEWIRE ZIPWRE .038 STRAIGHT (WIRE) IMPLANT
IV NS 1000ML (IV SOLUTION) ×2
IV NS 1000ML BAXH (IV SOLUTION) ×1 IMPLANT
KIT TURNOVER KIT A (KITS) IMPLANT
LASER FIB FLEXIVA PULSE ID 365 (Laser) IMPLANT
LOOP CUT BIPOLAR 24F LRG (ELECTROSURGICAL) IMPLANT
MANIFOLD NEPTUNE II (INSTRUMENTS) ×2 IMPLANT
PACK CYSTO (CUSTOM PROCEDURE TRAY) ×2 IMPLANT
PENCIL SMOKE EVACUATOR (MISCELLANEOUS) IMPLANT
SHEATH NAVIGATOR HD 11/13X36 (SHEATH) IMPLANT
SYR TOOMEY IRRIG 70ML (MISCELLANEOUS)
SYRINGE TOOMEY IRRIG 70ML (MISCELLANEOUS) IMPLANT
TRACTIP FLEXIVA PULS ID 200XHI (Laser) IMPLANT
TRACTIP FLEXIVA PULSE ID 200 (Laser)
TUBING CONNECTING 10 (TUBING) ×2 IMPLANT
TUBING UROLOGY SET (TUBING) ×2 IMPLANT

## 2021-08-31 NOTE — Anesthesia Procedure Notes (Signed)
Procedure Name: LMA Insertion Date/Time: 08/31/2021 1:46 PM Performed by: Jarvin Ogren D, CRNA Pre-anesthesia Checklist: Patient identified, Emergency Drugs available, Suction available and Patient being monitored Patient Re-evaluated:Patient Re-evaluated prior to induction Oxygen Delivery Method: Circle system utilized Preoxygenation: Pre-oxygenation with 100% oxygen Induction Type: IV induction Ventilation: Mask ventilation without difficulty LMA: LMA inserted LMA Size: 4.0 Tube type: Oral Number of attempts: 1 Placement Confirmation: positive ETCO2 and breath sounds checked- equal and bilateral Tube secured with: Tape Dental Injury: Teeth and Oropharynx as per pre-operative assessment

## 2021-08-31 NOTE — Discharge Instructions (Signed)
You may resume activity and diet as tolerated.  No restrictions from procedure.  You will be called to schedule a percutaneous biopsy of your kidney mass prior to your follow up.

## 2021-08-31 NOTE — Op Note (Signed)
Preoperative diagnosis: Left renal mass  Postoperative diagnosis: Left renal mass  Procedures: 1.  Cystoscopy 2.  Left retrograde pyelography with interpretation  Surgeon: Pryor Curia MD  Anesthesia: General  Complications: None  EBL: None  Specimens: None  Intraoperative findings: A left retrograde pyelogram was performed with a 6 French ureteral catheter and Omnipaque contrast.  This demonstrated normal caliber ureter without filling defects.  There was splaying of the left renal collecting system in the interpolar/lower polar calyceal systems without filling defects within the renal collecting system.  No hydronephrosis.  Indication: Gabriela Wallace is a 76 year old female with an growing centrally located enhancing left renal mass.  This was suspicious for renal cell carcinoma versus urothelial carcinoma.  She presents today for cystoscopy and retrograde pyelogram to help determine what type of malignancy may be present.  She was recommended to undergo the above procedures with possible ureteroscopy and biopsy if a filling defect was noted.  The potential risks, complications, and the expected recovery process was discussed in detail.  Informed consent was obtained.  Description of procedure: The patient was taken the operating room and a general anesthetic was administered.  She was given preoperative antibiotics, placed in the dorsolithotomy position, and prepped and draped in usual sterile fashion.  Next, a preoperative timeout was performed.  Cystourethroscopy was then performed in a systematic examination of the bladder revealed no bladder tumors, stones, or other mucosal pathology.  The ureteral orifice ease were in their normal anatomic positions and effluxing clear urine.  Attention then turned to the left ureteral orifice.  A 6 French ureteral catheter was used to intubate the left ureteral orifice and Omnipaque contrast was injected.  This demonstrated no filling defects  within the ureter.  There was noted to be splaying of the interpolar and lower polar calyceal system consistent with an extrinsic mass likely consistent with renal cell carcinoma.  No filling defects were noted to suggest a urothelial tumor.  As such, the procedure was ended.  The patient's bladder was emptied and she tolerated the procedure well without complications.  She was able to be awakened transferred to recovery unit in satisfactory condition.

## 2021-08-31 NOTE — Transfer of Care (Signed)
Immediate Anesthesia Transfer of Care Note  Patient: Gabriela Wallace  Procedure(s) Performed: CYSTOSCOPY WITH RETROGRADE (Left)  Patient Location: PACU  Anesthesia Type:General  Level of Consciousness: awake, alert  and oriented  Airway & Oxygen Therapy: Patient Spontanous Breathing and Patient connected to face mask oxygen  Post-op Assessment: Report given to RN and Post -op Vital signs reviewed and stable  Post vital signs: Reviewed and stable  Last Vitals:  Vitals Value Taken Time  BP 143/83 08/31/21 1415  Temp 36.8 C 08/31/21 1415  Pulse 73 08/31/21 1418  Resp 10 08/31/21 1418  SpO2 99 % 08/31/21 1418  Vitals shown include unvalidated device data.  Last Pain:  Vitals:   08/31/21 1415  TempSrc:   PainSc: 0-No pain         Complications: No notable events documented.

## 2021-08-31 NOTE — Anesthesia Postprocedure Evaluation (Signed)
Anesthesia Post Note  Patient: Gabriela Wallace  Procedure(s) Performed: CYSTOSCOPY WITH RETROGRADE (Left)     Patient location during evaluation: PACU Anesthesia Type: General Level of consciousness: awake and alert Pain management: pain level controlled Vital Signs Assessment: post-procedure vital signs reviewed and stable Respiratory status: spontaneous breathing, nonlabored ventilation, respiratory function stable and patient connected to nasal cannula oxygen Cardiovascular status: blood pressure returned to baseline and stable Postop Assessment: no apparent nausea or vomiting Anesthetic complications: no   No notable events documented.  Last Vitals:  Vitals:   08/31/21 1445 08/31/21 1450  BP: 134/78 (!) 149/71  Pulse: 68 76  Resp: 17 15  Temp: 36.7 C 36.6 C  SpO2: 94% 98%    Last Pain:  Vitals:   08/31/21 1450  TempSrc:   PainSc: 0-No pain                 Belenda Cruise P Jamichael Knotts

## 2021-08-31 NOTE — Anesthesia Preprocedure Evaluation (Addendum)
Anesthesia Evaluation  Patient identified by MRN, date of birth, ID band Patient awake    Reviewed: Allergy & Precautions, NPO status , Patient's Chart, lab work & pertinent test results  Airway Mallampati: II       Dental no notable dental hx.    Pulmonary neg pulmonary ROS,    Pulmonary exam normal        Cardiovascular hypertension,  Rhythm:Regular Rate:Normal     Neuro/Psych Seizures -, Well Controlled,  Anxiety Depression    GI/Hepatic Neg liver ROS, GERD  Medicated,  Endo/Other  negative endocrine ROS  Renal/GU negative Renal ROSRenal mass  negative genitourinary   Musculoskeletal  (+) Arthritis , Osteoarthritis,    Abdominal Normal abdominal exam  (+)   Peds  Hematology negative hematology ROS (+)   Anesthesia Other Findings   Reproductive/Obstetrics                            Anesthesia Physical Anesthesia Plan  ASA: 3  Anesthesia Plan: General   Post-op Pain Management:    Induction: Intravenous  PONV Risk Score and Plan: 3 and Ondansetron, Dexamethasone and Treatment may vary due to age or medical condition  Airway Management Planned: Mask and LMA  Additional Equipment: None  Intra-op Plan:   Post-operative Plan: Extubation in OR  Informed Consent: I have reviewed the patients History and Physical, chart, labs and discussed the procedure including the risks, benefits and alternatives for the proposed anesthesia with the patient or authorized representative who has indicated his/her understanding and acceptance.     Dental advisory given  Plan Discussed with: CRNA  Anesthesia Plan Comments:         Anesthesia Quick Evaluation

## 2021-09-01 ENCOUNTER — Encounter (HOSPITAL_COMMUNITY): Payer: Self-pay | Admitting: Urology

## 2021-09-02 ENCOUNTER — Other Ambulatory Visit: Payer: Self-pay | Admitting: Urology

## 2021-09-02 ENCOUNTER — Other Ambulatory Visit (HOSPITAL_COMMUNITY): Payer: Self-pay | Admitting: Urology

## 2021-09-02 DIAGNOSIS — D49512 Neoplasm of unspecified behavior of left kidney: Secondary | ICD-10-CM

## 2021-09-04 NOTE — Progress Notes (Unsigned)
Gabriela Cleveland, MD  [name]   CT core L renal mass  May want to look with [place] 1st on procedure day to see if lesion more conspicuous   DDH

## 2021-09-18 ENCOUNTER — Other Ambulatory Visit: Payer: Self-pay | Admitting: Radiology

## 2021-09-18 ENCOUNTER — Other Ambulatory Visit: Payer: Self-pay | Admitting: Internal Medicine

## 2021-09-18 DIAGNOSIS — N2889 Other specified disorders of kidney and ureter: Secondary | ICD-10-CM

## 2021-09-19 ENCOUNTER — Other Ambulatory Visit: Payer: Self-pay

## 2021-09-19 ENCOUNTER — Ambulatory Visit (HOSPITAL_COMMUNITY)
Admission: RE | Admit: 2021-09-19 | Discharge: 2021-09-19 | Disposition: A | Discharge status: Home / Self Care | Payer: Medicare HMO | Source: Ambulatory Visit | Attending: Urology | Admitting: Urology

## 2021-09-19 ENCOUNTER — Encounter (HOSPITAL_COMMUNITY): Payer: Self-pay

## 2021-09-19 ENCOUNTER — Other Ambulatory Visit: Payer: Self-pay | Admitting: Radiology

## 2021-09-19 DIAGNOSIS — C642 Malignant neoplasm of left kidney, except renal pelvis: Secondary | ICD-10-CM | POA: Insufficient documentation

## 2021-09-19 DIAGNOSIS — N2889 Other specified disorders of kidney and ureter: Secondary | ICD-10-CM

## 2021-09-19 DIAGNOSIS — D49512 Neoplasm of unspecified behavior of left kidney: Secondary | ICD-10-CM

## 2021-09-19 LAB — CBC
HCT: 38.5 % (ref 36.0–46.0)
Hemoglobin: 12.5 g/dL (ref 12.0–15.0)
MCH: 26.8 pg (ref 26.0–34.0)
MCHC: 32.5 g/dL (ref 30.0–36.0)
MCV: 82.6 fL (ref 80.0–100.0)
Platelets: 250 10*3/uL (ref 150–400)
RBC: 4.66 MIL/uL (ref 3.87–5.11)
RDW: 13.9 % (ref 11.5–15.5)
WBC: 6.2 10*3/uL (ref 4.0–10.5)
nRBC: 0 % (ref 0.0–0.2)

## 2021-09-19 LAB — PROTIME-INR
INR: 1 (ref 0.8–1.2)
Prothrombin Time: 12.9 seconds (ref 11.4–15.2)

## 2021-09-19 MED ORDER — GELATIN ABSORBABLE 12-7 MM EX MISC
CUTANEOUS | Status: AC
  Filled 2021-09-19: qty 1

## 2021-09-19 MED ORDER — FENTANYL CITRATE (PF) 100 MCG/2ML IJ SOLN
INTRAMUSCULAR | Status: AC | PRN
  Administered 2021-09-19 (×2): 25 ug via INTRAVENOUS

## 2021-09-19 MED ORDER — MIDAZOLAM HCL 2 MG/2ML IJ SOLN
INTRAMUSCULAR | Status: AC | PRN
  Administered 2021-09-19: .5 mg via INTRAVENOUS
  Administered 2021-09-19: 1 mg via INTRAVENOUS

## 2021-09-19 MED ORDER — LIDOCAINE HCL 1 % IJ SOLN
INTRAMUSCULAR | Status: AC
  Filled 2021-09-19: qty 10

## 2021-09-19 MED ORDER — FENTANYL CITRATE (PF) 100 MCG/2ML IJ SOLN
INTRAMUSCULAR | Status: AC
  Filled 2021-09-19: qty 2

## 2021-09-19 MED ORDER — HYDRALAZINE HCL 20 MG/ML IJ SOLN
INTRAMUSCULAR | Status: AC | PRN
  Administered 2021-09-19: 10 mg via INTRAVENOUS

## 2021-09-19 MED ORDER — HYDRALAZINE HCL 20 MG/ML IJ SOLN
INTRAMUSCULAR | Status: AC
  Filled 2021-09-19: qty 1

## 2021-09-19 MED ORDER — SODIUM CHLORIDE 0.9 % IV SOLN: INTRAVENOUS | Status: DC

## 2021-09-19 MED ORDER — MIDAZOLAM HCL 2 MG/2ML IJ SOLN
INTRAMUSCULAR | Status: AC
  Filled 2021-09-19: qty 2

## 2021-09-19 NOTE — Progress Notes (Signed)
Patient was given discharge instructions. She verbalized understanding. 

## 2021-09-19 NOTE — H&P (Signed)
Chief Complaint: Patient was seen in consultation today for left renal mass biopsy at the request of [place]  Referring Physician(s): Borden,Lester  Supervising Physician: Mir, Sharen Heck  Patient Status: [facility] - Out-pt  History of Present Illness: Gabriela Wallace is a 76 y.o. female   All started with evaluation of abd pain per pt--- MRI in January revealed "ovarian abnormality" and was worked up with OB/GYN No note in chart - "No Cancer" per pt Indeterminate Left renal mass was seen and was later referred to Urologist She denies pain; or hematuria  CT 07/24/21 IMPRESSION:  1. Very subtle mass lesion in the anterior interpolar left kidney  measuring up to 2.8 cm on today's study (increased from 2.2 cm on  09/18/2020). Imaging features are increasingly suspicious for  neoplasm, including papillary renal cell carcinoma.  2. Duplicated right intrarenal collecting system.  3. Aortic Atherosclerosis (ICD10-I70.0).   Was seen with Dr Alinda Money 08/31/21: PLAN:      1. Left renal mass: I am more concerned that this mass that is centrally located in the upper portion of her left kidney is a well-defined an enhancing mass based on her CT scan. This is centrally located and I have recommended that we proceed with further evaluation including cystoscopy, left retrograde pyelography, possible left ureteroscopy with biopsy and possible left ureteral stent. We have reviewed this procedure in detail including the potential risks and the recovery process. If this is nondiagnostic and rules out urothelial carcinoma, I have recommended that she proceed with a percutaneous biopsy subsequently to determine whether this lesion is malignant or not as surgical treatment would likely require a total nephrectomy if this is renal cell carcinoma considering the central location of the tumor. She will proceed as discussed with further recommendations pending the results of upcoming evaluation/biopsy.     Scheduled now for left renal mass biopsy in IR She is aware and agreeable     Past Medical History:  Diagnosis Date   Anxiety    Arthritis    Depression    Hypertension    Neuropathy    Seizures ([place])    last seizure 10 yrs ago. unknown etiology and on no meds now.    Past Surgical History:  Procedure Laterality Date   ABDOMINAL HYSTERECTOMY     CATARACT EXTRACTION     right eye-Dr Hunt-[facility]   CATARACT EXTRACTION W/PHACO Right 06/09/2012   Procedure: CATARACT EXTRACTION PHACO AND INTRAOCULAR LENS PLACEMENT (IOC);  Surgeon: Tonny Branch, MD;  Location: [facility];  Service: Ophthalmology;  Laterality: Right;  CDE=22.34   CYSTOSCOPY WITH URETEROSCOPY AND STENT PLACEMENT Left 08/31/2021   Procedure: CYSTOSCOPY WITH RETROGRADE;  Surgeon: Raynelle Bring, MD;  Location: [facility];  Service: Urology;  Laterality: Left;   KNEE ARTHROSCOPY WITH MEDIAL MENISECTOMY Right 07/27/2015   Procedure: KNEE ARTHROSCOPY WITH MEDIAL MENISECTOMY WITH REMOVAL OF LOOSE BODY;  Surgeon: Carole Civil, MD;  Location: [facility];  Service: Orthopedics;  Laterality: Right;    Allergies: Patient has no known allergies.  Medications: Prior to Admission medications   Medication Sig Start Date End Date Taking? Authorizing Provider  acetaminophen (TYLENOL) 500 MG tablet Take 1,000 mg by mouth every 6 (six) hours as needed for moderate pain.    [provider]  citalopram (CELEXA) 10 MG tablet Take 10 mg by mouth daily. 08/15/21   [provider]  hydrochlorothiazide (HYDRODIURIL) 25 MG tablet Take 25 mg by mouth every morning. 03/21/21   [provider]  Multiple Vitamin (  MULTIVITAMIN WITH MINERALS) TABS tablet Take 1 tablet by mouth daily.    [provider]  pantoprazole (PROTONIX) 20 MG tablet Take 20 mg by mouth daily. 07/18/21   [provider]  potassium chloride SA (KLOR-CON M) 20 MEQ tablet Take 20 mEq by mouth daily. 08/15/21   [provider]  Vitamin D,  Ergocalciferol, (DRISDOL) 1.25 MG (50000 UNIT) CAPS capsule Take 50,000 Units by mouth every Monday.    [provider]     Family History  Problem Relation Age of Onset   Cancer Sister    Heart disease Maternal Grandmother    Neuropathy Neg Hx     Social History   Socioeconomic History   Marital status: Widowed    Spouse name: Not on file   Number of children: Not on file   Years of education: 9   Highest education level: Not on file  Occupational History   Occupation: [employer]  Tobacco Use   Smoking status: Never   Smokeless tobacco: Never  Vaping Use   Vaping Use: Never used  Substance and Sexual Activity   Alcohol use: Yes    Alcohol/week: 1.0 standard drink of alcohol    Types: 1 Glasses of wine per week    Comment: occassional   Drug use: No   Sexual activity: Yes    Birth control/protection: Surgical  Other Topics Concern   Not on file  Social History Narrative   Lives alone   Caffeine use: 2 cups coffee per day   Social Determinants of Health   Financial Resource Strain: Not on file  Food Insecurity: Not on file  Transportation Needs: Not on file  Physical Activity: Not on file  Stress: Not on file  Social Connections: Not on file   Review of Systems: A 12 point ROS discussed and pertinent positives are indicated in the HPI above.  All other systems are negative.  Review of Systems  Constitutional:  Negative for activity change, fatigue and fever.  Respiratory:  Negative for cough and shortness of breath.   Cardiovascular:  Negative for chest pain.  Gastrointestinal:  Negative for abdominal pain.  Genitourinary:  Negative for difficulty urinating, dysuria, flank pain, hematuria and vaginal bleeding.  Psychiatric/Behavioral:  Negative for behavioral problems and confusion.     Vital Signs: Pulse 72   Temp 98.1 F (36.7 C) (Temporal)   Resp 17   Ht '5\' 6"'$  (1.676 m)   Wt 235 lb (106.6 kg)   SpO2 95%   BMI 37.93 kg/m      Physical Exam Vitals reviewed.  HENT:     Mouth/Throat:     Mouth: Mucous membranes are moist.  Cardiovascular:     Rate and Rhythm: Normal rate and regular rhythm.     Heart sounds: Normal heart sounds.  Pulmonary:     Effort: Pulmonary effort is normal.     Breath sounds: Normal breath sounds.  Abdominal:     Palpations: Abdomen is soft.     Tenderness: There is no abdominal tenderness.  Musculoskeletal:        General: Normal range of motion.     Right lower leg: No edema.     Left lower leg: No edema.  Skin:    General: Skin is warm.  Neurological:     Mental Status: She is alert and oriented to person, place, and time.  Psychiatric:        Behavior: Behavior normal.     Imaging: DG C-Arm  1-60 Min-No Report  Result Date: 08/31/2021 Fluoroscopy was utilized by the requesting physician.  No radiographic interpretation.    Labs:  CBC: Recent Labs    08/22/21 1342  WBC 5.3  HGB 13.1  HCT 39.9  PLT 263    COAGS: No results for input(s): "INR", "APTT" in the last 8760 hours.  BMP: Recent Labs    08/22/21 1342  NA 138  K 4.2  CL 105  CO2 26  GLUCOSE 107*  BUN 11  CALCIUM 9.4  CREATININE 1.01*  GFRNONAA 58*    LIVER FUNCTION TESTS: No results for input(s): "BILITOT", "AST", "ALT", "ALKPHOS", "PROT", "ALBUMIN" in the last 8760 hours.  TUMOR MARKERS: No results for input(s): "AFPTM", "CEA", "CA199", "CHROMGRNA" in the last 8760 hours.  Assessment and Plan:  Scheduled for left renal mass biopsy Risks and benefits of left renal mass biopsy was discussed with the patient and/or patient's family including, but not limited to bleeding, infection, damage to adjacent structures or low yield requiring additional tests.  All of the questions were answered and there is agreement to proceed.  Consent signed and in chart.   Thank you for this interesting consult.  I greatly enjoyed meeting Gabriela Wallace and look forward to participating in their  care.  A copy of this report was sent to the requesting provider on this date.  Electronically Signed: Lavonia Drafts, PA-C 09/19/2021, 7:22 AM   I spent a total of  30 Minutes   in face to face in clinical consultation, greater than 50% of which was counseling/coordinating care for left renal mass bx

## 2021-09-19 NOTE — Procedures (Signed)
Interventional Radiology Procedure Note  Procedure: CT guided biopsy of left renal mass  Indication: left renal mass  Findings: Please refer to procedural dictation for full description.  Complications: None  EBL: < 10 mL  Miachel Roux, MD [phone]

## 2021-09-19 NOTE — Sedation Documentation (Signed)
Patient blood pressure 180/100 after being positioned in prone procedure for CT renal biopsy. Dr. Dwaine Gale aware. Patient given sedation medication to help with pain and anxiety before proceeding with procedure to help decrease blood pressure. Patient aware of blood pressure and updated by MD. Will continue to monitor.

## 2021-09-21 LAB — SURGICAL PATHOLOGY

## 2021-10-09 ENCOUNTER — Telehealth: Payer: Self-pay | Admitting: Oncology

## 2021-10-09 NOTE — Telephone Encounter (Signed)
Scheduled appt per 7/7 referral. Pt is aware of appt date and time. Pt is aware to arrive 15 mins prior to appt time and to bring and updated insurance card. Pt is aware of appt location.

## 2021-11-01 ENCOUNTER — Other Ambulatory Visit: Payer: Self-pay

## 2021-11-01 ENCOUNTER — Inpatient Hospital Stay: Payer: Medicare HMO | Attending: Oncology | Admitting: Oncology

## 2021-11-01 VITALS — BP 170/83 | HR 69 | Temp 98.1°F | Resp 15 | Wt 249.8 lb

## 2021-11-01 DIAGNOSIS — I10 Essential (primary) hypertension: Secondary | ICD-10-CM | POA: Diagnosis not present

## 2021-11-01 DIAGNOSIS — C642 Malignant neoplasm of left kidney, except renal pelvis: Secondary | ICD-10-CM | POA: Diagnosis not present

## 2021-11-01 DIAGNOSIS — R918 Other nonspecific abnormal finding of lung field: Secondary | ICD-10-CM | POA: Diagnosis not present

## 2021-11-01 DIAGNOSIS — G629 Polyneuropathy, unspecified: Secondary | ICD-10-CM | POA: Diagnosis not present

## 2021-11-01 DIAGNOSIS — C649 Malignant neoplasm of unspecified kidney, except renal pelvis: Secondary | ICD-10-CM | POA: Insufficient documentation

## 2021-11-01 DIAGNOSIS — Z79899 Other long term (current) drug therapy: Secondary | ICD-10-CM | POA: Insufficient documentation

## 2021-11-01 NOTE — Progress Notes (Signed)
Reason for the request: Kidney cancer  HPI: I was asked by Dr. Alinda Money to evaluate Gabriela Wallace for the evaluation of kidney cancer.  She is a 76 year old woman with history of hypertension and neuropathy who was found to have a left renal mass.  MRI at that time showed a 2.2 cm mass located in the left kidney and was evaluated initially in January of fall 2023 by Dr. Alinda Money.  Repeat imaging studies subsequently showed a significant growth up to 2.8 cm with clear enhancement in May 2023.  CT scan of the chest obtained on October 04, 2021 showed multiple small bilateral pulmonary nodules with varying apology.  Several of these nodules are solid measuring up to 28 cm in the right apex.  Highly suspicious for metastatic disease.  Additional nodules are more amorphous or groundglass including subsolid nodules in the medial left upper lobe.  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  Clinically, she reports feeling well without any major complaints.  She denies any hematuria, dysuria or bone pain.  She denies any respiratory complaints.  She does not report any headaches, blurry vision, syncope or seizures. Does not report any fevers, chills or sweats.  Does not report any cough, wheezing or hemoptysis.  Does not report any chest pain, palpitation, orthopnea or leg edema.  Does not report any nausea, vomiting or abdominal pain.  Does not report any constipation or diarrhea.  Does not report any skeletal complaints.    Does not report frequency, urgency or hematuria.  Does not report any skin rashes or lesions. Does not report any heat or cold intolerance.  Does not report any lymphadenopathy or petechiae.  Does not report any anxiety or depression.  Remaining review of systems is negative.     Past Medical History:  Diagnosis Date   Anxiety    Arthritis     Depression    Hypertension    Neuropathy    Seizures ([place])    last seizure 10 yrs ago. unknown etiology and on no meds now.  :   Past Surgical History:  Procedure Laterality Date   ABDOMINAL HYSTERECTOMY     CATARACT EXTRACTION     right eye-Dr Hunt-[facility]   CATARACT EXTRACTION W/PHACO Right 06/09/2012   Procedure: CATARACT EXTRACTION PHACO AND INTRAOCULAR LENS PLACEMENT (IOC);  Surgeon: Tonny Branch, MD;  Location: [facility];  Service: Ophthalmology;  Laterality: Right;  CDE=22.34   CYSTOSCOPY WITH URETEROSCOPY AND STENT PLACEMENT Left 08/31/2021   Procedure: CYSTOSCOPY WITH RETROGRADE;  Surgeon: Raynelle Bring, MD;  Location: [facility];  Service: Urology;  Laterality: Left;   KNEE ARTHROSCOPY WITH MEDIAL MENISECTOMY Right 07/27/2015   Procedure: KNEE ARTHROSCOPY WITH MEDIAL MENISECTOMY WITH REMOVAL OF LOOSE BODY;  Surgeon: Carole Civil, MD;  Location: [facility];  Service: Orthopedics;  Laterality: Right;  :   Current Outpatient Medications:    acetaminophen (TYLENOL) 500 MG tablet, Take 1,000 mg by mouth every 6 (six) hours as needed for moderate pain., Disp: , Rfl:    citalopram (CELEXA) 10 MG tablet, Take 10 mg by mouth daily., Disp: , Rfl:    hydrochlorothiazide (HYDRODIURIL) 25 MG tablet, Take 25 mg by mouth every morning., Disp: , Rfl:    Multiple Vitamin (MULTIVITAMIN WITH MINERALS) TABS tablet, Take 1 tablet by mouth daily., Disp: , Rfl:    pantoprazole (PROTONIX) 20 MG tablet, Take 20  mg by mouth daily., Disp: , Rfl:    potassium chloride SA (KLOR-CON M) 20 MEQ tablet, Take 20 mEq by mouth daily., Disp: , Rfl:    Vitamin D, Ergocalciferol, (DRISDOL) 1.25 MG (50000 UNIT) CAPS capsule, Take 50,000 Units by mouth every Monday., Disp: , Rfl: :  No Known Allergies:   Family History  Problem Relation Age of Onset   Cancer Sister    Heart disease Maternal Grandmother    Neuropathy Neg Hx   :   Social History   Socioeconomic History   Marital status: Widowed    Spouse name: Not on  file   Number of children: Not on file   Years of education: 9   Highest education level: Not on file  Occupational History   Occupation: [employer]  Tobacco Use   Smoking status: Never   Smokeless tobacco: Never  Vaping Use   Vaping Use: Never used  Substance and Sexual Activity   Alcohol use: Yes    Alcohol/week: 1.0 standard drink of alcohol    Types: 1 Glasses of wine per week    Comment: occassional   Drug use: No   Sexual activity: Yes    Birth control/protection: Surgical  Other Topics Concern   Not on file  Social History Narrative   Lives alone   Caffeine use: 2 cups coffee per day   Social Determinants of Health   Financial Resource Strain: Not on file  Food Insecurity: Not on file  Transportation Needs: Not on file  Physical Activity: Not on file  Stress: Not on file  Social Connections: Not on file  Intimate Partner Violence: Not on file  :  Pertinent items are noted in HPI.  Exam: Blood pressure (!) 170/83, pulse 69, temperature 98.1 F (36.7 C), temperature source Temporal, resp. rate 15, weight 249 lb 12.8 oz (113.3 kg), SpO2 97 %. ECOG 1 General appearance: alert and cooperative appeared without distress. Head: atraumatic without any abnormalities. Eyes: conjunctivae/corneas clear. PERRL.  Sclera anicteric. Throat: lips, mucosa, and tongue normal; without oral thrush or ulcers. Resp: clear to auscultation bilaterally without rhonchi, wheezes or dullness to percussion. Cardio: regular rate and rhythm, S1, S2 normal, no murmur, click, rub or gallop GI: soft, non-tender; bowel sounds normal; no masses,  no organomegaly Skin: Skin color, texture, turgor normal. No rashes or lesions Lymph nodes: Cervical, supraclavicular, and axillary nodes normal. Neurologic: Grossly normal without any motor, sensory or deep tendon reflexes. Musculoskeletal: No joint deformity or effusion.    Assessment and Plan:   76 year old with:  1.  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  She has pulmonary nodules indicating possibly stage IV disease.  Management options at this time were discussed.  Upfront nephrectomy and observing for pulmonary nodules 1 option versus fully investigating these pulmonary nodules including PET scan and possible biopsy.  An alternative approach would be to proceed with systemic therapy anyways given the high suspicion of metastatic disease and reserve nephrectomy as a salvage option if she had an excellent response to treatment.  Systemic treatment options including upfront ipilimumab and nivolumab would be a reasonable approach.  Complications that include autoimmune concerns including arthralgias, myalgias, pneumonitis, hepatitis among others.  These agents will be given once every 3 weeks for a total of 4 cycles before repeating imaging studies.  These options were debated at this time and after discussion, I recommended obtaining PET scan and based on these results we will  proceed with either potential biopsy versus proceeding with systemic therapy.  She is agreeable to proceed with a PET scan and she will consider systemic therapy after these results.  2.  IV access: Peripheral veins will be in use  3.  Autoimmune complications: These will be monitored periodically including thyroid function.  4.  Follow-up: will be in the next few weeks after obtaining the PET scan.   60  minutes were dedicated to this visit. The time was spent on reviewing laboratory data, imaging studies, discussing treatment options, discussing differential diagnosis and answering questions regarding future plan.     A copy of this consult has been forwarded to the requesting physician.

## 2021-11-03 ENCOUNTER — Telehealth: Payer: Self-pay | Admitting: Oncology

## 2021-11-03 NOTE — Telephone Encounter (Signed)
Called patient regarding upcoming August appointment, left a voicemail. 

## 2021-11-09 ENCOUNTER — Encounter (HOSPITAL_COMMUNITY)
Admission: RE | Admit: 2021-11-09 | Discharge: 2021-11-09 | Disposition: A | Discharge status: Home / Self Care | Payer: Medicare HMO | Source: Ambulatory Visit | Attending: Oncology | Admitting: Oncology

## 2021-11-09 DIAGNOSIS — C642 Malignant neoplasm of left kidney, except renal pelvis: Secondary | ICD-10-CM | POA: Insufficient documentation

## 2021-11-09 LAB — GLUCOSE, CAPILLARY: Glucose-Capillary: 104 mg/dL — ABNORMAL HIGH (ref 70–99)

## 2021-11-09 MED ORDER — FLUDEOXYGLUCOSE F - 18 (FDG) INJECTION
10.0000 | Freq: Once | INTRAVENOUS | Status: AC
  Administered 2021-11-09: 12.4 via INTRAVENOUS

## 2021-11-16 ENCOUNTER — Inpatient Hospital Stay (HOSPITAL_BASED_OUTPATIENT_CLINIC_OR_DEPARTMENT_OTHER): Payer: Medicare HMO | Admitting: Oncology

## 2021-11-16 ENCOUNTER — Other Ambulatory Visit: Payer: Self-pay

## 2021-11-16 VITALS — BP 164/75 | HR 72 | Temp 97.7°F | Resp 18 | Ht 66.0 in | Wt 248.8 lb

## 2021-11-16 DIAGNOSIS — R918 Other nonspecific abnormal finding of lung field: Secondary | ICD-10-CM | POA: Diagnosis not present

## 2021-11-16 DIAGNOSIS — C642 Malignant neoplasm of left kidney, except renal pelvis: Secondary | ICD-10-CM

## 2021-11-16 DIAGNOSIS — Z79899 Other long term (current) drug therapy: Secondary | ICD-10-CM | POA: Diagnosis not present

## 2021-11-16 NOTE — Progress Notes (Signed)
Hematology and Oncology Follow Up Visit  Gabriela Wallace [id number] [birth year] 76 y.o. 11/16/2021 3:02 PM [name], MDRucker, Nicole Kindred, MD   Principle Diagnosis: 76 year old with left kidney cancer diagnosed in June 2023.  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  She was found to have pulmonary nodules although PET scan indicate likely benign process.   Current therapy: Under evaluation for radical cystectomy.  Interim History: Gabriela Wallace returns today for a follow-up visit.  Since last visit, she completed PET scan and returns today to discuss it.  Clinically, she reports feeling well without any major complaints.  She denies any back pain or flank pain.  Hematuria or dysuria.     Medications: I have reviewed the patient's current medications.  Current Outpatient Medications  Medication Sig Dispense Refill   acetaminophen (TYLENOL) 500 MG tablet Take 1,000 mg by mouth every 6 (six) hours as needed for moderate pain.     citalopram (CELEXA) 10 MG tablet Take 10 mg by mouth daily.     hydrochlorothiazide (HYDRODIURIL) 25 MG tablet Take 25 mg by mouth every morning.     Multiple Vitamin (MULTIVITAMIN WITH MINERALS) TABS tablet Take 1 tablet by mouth daily.     pantoprazole (PROTONIX) 20 MG tablet Take 20 mg by mouth daily.     potassium chloride SA (KLOR-CON M) 20 MEQ tablet Take 20 mEq by mouth daily.     traMADol (ULTRAM) 50 MG tablet Take 50 mg by mouth every 6 (six) hours as needed.     Vitamin D, Ergocalciferol, (DRISDOL) 1.25 MG (50000 UNIT) CAPS capsule Take 50,000 Units by mouth every Monday.     No current facility-administered medications for this visit.     Allergies: No Known Allergies    Physical Exam: Blood pressure (!) 164/75, pulse 72, temperature 97.7 F (36.5 C), temperature source Tympanic, resp. rate 18, height '5\' 6"'$  (1.676 m), weight 248 lb 12.8 oz (112.9 kg), SpO2 95 %.  ECOG: 1    General  appearance: Comfortable appearing without any discomfort Head: Normocephalic without any trauma Oropharynx: Mucous membranes are moist and pink without any thrush or ulcers. Eyes: Pupils are equal and round reactive to light. Lymph nodes: No cervical, supraclavicular, inguinal or axillary lymphadenopathy.   Heart:regular rate and rhythm.  S1 and S2 without leg edema. Lung: Clear without any rhonchi or wheezes.  No dullness to percussion. Abdomin: Soft, nontender, nondistended with good bowel sounds.  No hepatosplenomegaly. Musculoskeletal: No joint deformity or effusion.  Full range of motion noted. Neurological: No deficits noted on motor, sensory and deep tendon reflex exam. Skin: No petechial rash or dryness.  Appeared moist.     Lab Results: Lab Results  Component Value Date   WBC 6.2 09/19/2021   HGB 12.5 09/19/2021   HCT 38.5 09/19/2021   MCV 82.6 09/19/2021   PLT 250 09/19/2021     Chemistry      Component Value Date/Time   NA 138 08/22/2021 1342   NA 139 06/29/2015 1428   K 4.2 08/22/2021 1342   CL 105 08/22/2021 1342   CO2 26 08/22/2021 1342   BUN 11 08/22/2021 1342   BUN 14 06/29/2015 1428   CREATININE 1.01 (H) 08/22/2021 1342      Component Value Date/Time   CALCIUM 9.4 08/22/2021 1342   ALKPHOS 86 06/29/2015 1428   AST 19 06/29/2015 1428   ALT 9 06/29/2015 1428   BILITOT 0.4 06/29/2015 1428  Impression and Plan:  45 year old with:  1.  Kidney cancer diagnosed in June 2023.  She was found to have 2.8 cm clear-cell renal cell carcinoma involving the left kidney with rhabdoid features.   PET scan obtained on November 09, 2021 showed no evidence of widespread metastatic disease.  There are scattered solid and subsolid pulmonary nodules in both lungs.  The largest lesion is measuring 13 mm.  No hypermetabolic activity noted.  These findings do not support the diagnosis of metastatic renal cell carcinoma for the time being.  Based on these findings I  recommended proceeding with upfront nephrectomy and monitoring these pulmonary nodules.  If they grow in the future systemic therapy could be utilized.   2.  Bilateral pulmonary nodules: The largest is measuring 13 mm left upper lobe which could represent a different primary.  The other ones appear to be chronic actually in the right lower lung base.  I recommended monitoring and repeat imaging studies in 6 months.   3.  Follow-up: Happy to see her in the future as needed.     30  minutes were dedicated to this encounter.  The time was spent on reviewing laboratory data, disease status update, reviewing imaging studies and future plan of care discussion.        Zola Button, MD 8/17/20233:02 PM

## 2021-12-07 ENCOUNTER — Other Ambulatory Visit: Payer: Self-pay | Admitting: Urology

## 2021-12-08 NOTE — Patient Instructions (Addendum)
SURGICAL WAITING ROOM VISITATION Patients having surgery or a procedure may have no more than 2 support people in the waiting area - these visitors may rotate.   Children under the age of 39 must have an adult with them who is not the patient. If the patient needs to stay at the hospital during part of their recovery, the visitor guidelines for inpatient rooms apply. Pre-op nurse will coordinate an appropriate time for 1 support person to accompany patient in pre-op.  This support person may not rotate.    Please refer to the [facility] website for the visitor guidelines for Inpatients (after your surgery is over and you are in a regular room).      Your procedure is scheduled on: 12-18-21   Report to [facility] Main Entrance    Report to admitting at 5:15 AM   Call this number if you have problems the morning of surgery [phone]   Follow a clear liquid diet the day before surgery   Do not eat food or drink liquids :After Midnight.  Water Non-Citrus Juices (without pulp, NO RED) Carbonated Beverages Black Coffee (NO MILK/CREAM OR CREAMERS, sugar ok)  Clear Tea (NO MILK/CREAM OR CREAMERS, sugar ok) regular and decaf                             Plain Jell-O (NO RED)                                           Fruit ices (not with fruit pulp, NO RED)                                     Popsicles (NO RED)                                                               Sports drinks like Gatorade (NO RED)                    If you have questions, please contact your surgeon's office.   FOLLOW BOWEL PREP AND ANY ADDITIONAL PRE OP INSTRUCTIONS YOU RECEIVED FROM YOUR SURGEON'S OFFICE!!!     Oral Hygiene is also important to reduce your risk of infection.                                    Remember - BRUSH YOUR TEETH THE MORNING OF SURGERY WITH YOUR REGULAR TOOTHPASTE   Do NOT smoke after Midnight   Take these medicines the morning of surgery with A SIP OF WATER:   Citalopram, Pantoprazole, Tramadol if needed  DO NOT TAKE ANY ORAL DIABETIC MEDICATIONS DAY OF YOUR SURGERY  Bring CPAP mask and tubing day of surgery.                              You may not have any metal on your body including hair pins, jewelry,  and body piercing             Do not wear make-up, lotions, powders, perfumes or deodorant  Do not wear nail polish including gel and S&S, artificial/acrylic nails, or any other type of covering on natural nails including finger and toenails. If you have artificial nails, gel coating, etc. that needs to be removed by a nail salon please have this removed prior to surgery or surgery may need to be canceled/ delayed if the surgeon/ anesthesia feels like they are unable to be safely monitored.   Do not shave  48 hours prior to surgery.    Do not bring valuables to the hospital. [place].   Contacts, dentures or bridgework may not be worn into surgery.   Bring small overnight bag day of surgery.   DO NOT [place]. PHARMACY WILL DISPENSE MEDICATIONS LISTED ON YOUR MEDICATION LIST TO YOU DURING YOUR ADMISSION [place]!    Special Instructions: Bring a copy of your healthcare power of attorney and living will documents the day of surgery if you haven't scanned them before.  Please read over the following fact sheets you were given: IF YOU HAVE QUESTIONS ABOUT YOUR PRE-OP INSTRUCTIONS PLEASE CALL [place] - Preparing for Surgery Before surgery, you can play an important role.  Because skin is not sterile, your skin needs to be as free of germs as possible.  You can reduce the number of germs on your skin by washing with CHG (chlorahexidine gluconate) soap before surgery.  CHG is an antiseptic cleaner which kills germs and bonds with the skin to continue killing germs even after washing. Please DO NOT use if you have an allergy to CHG or  antibacterial soaps.  If your skin becomes reddened/irritated stop using the CHG and inform your nurse when you arrive at Short Stay. Do not shave (including legs and underarms) for at least 48 hours prior to the first CHG shower.  You may shave your face/neck.  Please follow these instructions carefully:  1.  Shower with CHG Soap the night before surgery and the  morning of surgery.  2.  If you choose to wash your hair, wash your hair first as usual with your normal  shampoo.  3.  After you shampoo, rinse your hair and body thoroughly to remove the shampoo.                             4.  Use CHG as you would any other liquid soap.  You can apply chg directly to the skin and wash.  Gently with a scrungie or clean washcloth.  5.  Apply the CHG Soap to your body ONLY FROM THE NECK DOWN.   Do   not use on face/ open                           Wound or open sores. Avoid contact with eyes, ears mouth and   genitals (private parts).                       Wash face,  Genitals (private parts) with your normal soap.             6.  Wash thoroughly, paying special attention to the area where your  surgery  will be performed.  7.  Thoroughly rinse your body with warm water from the neck down.  8.  DO NOT shower/wash with your normal soap after using and rinsing off the CHG Soap.                9.  Pat yourself dry with a clean towel.            10.  Wear clean pajamas.            11.  Place clean sheets on your bed the night of your first shower and do not  sleep with pets. Day of Surgery : Do not apply any lotions/deodorants the morning of surgery.  Please wear clean clothes to the hospital/surgery center.  FAILURE TO FOLLOW THESE INSTRUCTIONS MAY RESULT IN THE CANCELLATION OF YOUR SURGERY  PATIENT SIGNATURE_________________________________  NURSE SIGNATURE__________________________________  ________________________________________________________________________    WHAT IS A BLOOD TRANSFUSION?  Blood Transfusion Information  A transfusion is the replacement of blood or some of its parts. Blood is made up of multiple cells which provide different functions. Red blood cells carry oxygen and are used for blood loss replacement. White blood cells fight against infection. Platelets control bleeding. Plasma helps clot blood. Other blood products are available for specialized needs, such as hemophilia or other clotting disorders. BEFORE THE TRANSFUSION  Who gives blood for transfusions?  Healthy volunteers who are fully evaluated to make sure their blood is safe. This is blood bank blood. Transfusion therapy is the safest it has ever been in the practice of medicine. Before blood is taken from a donor, a complete history is taken to make sure that person has no history of diseases nor engages in risky social behavior (examples are intravenous drug use or sexual activity with multiple partners). The donor's travel history is screened to minimize risk of transmitting infections, such as malaria. The donated blood is tested for signs of infectious diseases, such as HIV and hepatitis. The blood is then tested to be sure it is compatible with you in order to minimize the chance of a transfusion reaction. If you or a relative donates blood, this is often done in anticipation of surgery and is not appropriate for emergency situations. It takes many days to process the donated blood. RISKS AND COMPLICATIONS Although transfusion therapy is very safe and saves many lives, the main dangers of transfusion include:  Getting an infectious disease. Developing a transfusion reaction. This is an allergic reaction to something in the blood you were given. Every precaution is taken to prevent this. The decision to have a blood transfusion has been considered carefully by your caregiver before blood is given. Blood is not given unless the benefits outweigh the risks. AFTER THE TRANSFUSION Right after receiving a  blood transfusion, you will usually feel much better and more energetic. This is especially true if your red blood cells have gotten low (anemic). The transfusion raises the level of the red blood cells which carry oxygen, and this usually causes an energy increase. The nurse administering the transfusion will monitor you carefully for complications. HOME CARE INSTRUCTIONS  No special instructions are needed after a transfusion. You may find your energy is better. Speak with your caregiver about any limitations on activity for underlying diseases you may have. SEEK MEDICAL CARE IF:  Your condition is not improving after your transfusion. You develop redness or irritation at the intravenous (IV) site. SEEK IMMEDIATE MEDICAL CARE IF:  Any of the following  symptoms occur over the next 12 hours: Shaking chills. You have a temperature by mouth above 102 F (38.9 C), not controlled by medicine. Chest, back, or muscle pain. People around you feel you are not acting correctly or are confused. Shortness of breath or difficulty breathing. Dizziness and fainting. You get a rash or develop hives. You have a decrease in urine output. Your urine turns a dark color or changes to pink, red, or brown. Any of the following symptoms occur over the next 10 days: You have a temperature by mouth above 102 F (38.9 C), not controlled by medicine. Shortness of breath. Weakness after normal activity. The white part of the eye turns yellow (jaundice). You have a decrease in the amount of urine or are urinating less often. Your urine turns a dark color or changes to pink, red, or brown. Document Released: 03/16/2000 Document Revised: 06/11/2011 Document Reviewed: 11/03/2007 [facility] Patient Information 2014 [place], [place].  _______________________________________________________________________

## 2021-12-08 NOTE — Progress Notes (Signed)
COVID Vaccine Completed:  Yes  Date of COVID positive in last 90 days:  PCP - Catalina Antigua, MD Cardiologist -   Chest x-ray - CT chest 10-04-21 [facility] EKG - 05-23-21 [facility] Stress Test -  ECHO -  Cardiac Cath -  Pacemaker/ICD device last checked: Spinal Cord Stimulator:  Bowel Prep -   Sleep Study -  CPAP -   Fasting Blood Sugar -  Checks Blood Sugar _____ times a day  Blood Thinner Instructions: Aspirin Instructions: Last Dose:  Activity level:  Can go up a flight of stairs and perform activities of daily living without stopping and without symptoms of chest pain or shortness of breath.  Able to exercise without symptoms  Unable to go up a flight of stairs without symptoms of     Anesthesia review:  Hx of seizures  Patient denies shortness of breath, fever, cough and chest pain at PAT appointment  Patient verbalized understanding of instructions that were given to them at the PAT appointment. Patient was also instructed that they will need to review over the PAT instructions again at home before surgery.

## 2021-12-12 ENCOUNTER — Encounter (HOSPITAL_COMMUNITY): Payer: Self-pay

## 2021-12-12 ENCOUNTER — Encounter (HOSPITAL_COMMUNITY)
Admission: RE | Admit: 2021-12-12 | Discharge: 2021-12-12 | Disposition: A | Discharge status: Home / Self Care | Payer: Medicare HMO | Source: Ambulatory Visit | Attending: Urology | Admitting: Urology

## 2021-12-12 ENCOUNTER — Other Ambulatory Visit: Payer: Self-pay

## 2021-12-12 VITALS — BP 152/79 | HR 68 | Temp 98.2°F | Resp 20 | Ht 66.0 in | Wt 248.2 lb

## 2021-12-12 DIAGNOSIS — Z01812 Encounter for preprocedural laboratory examination: Secondary | ICD-10-CM | POA: Diagnosis present

## 2021-12-12 DIAGNOSIS — R7303 Prediabetes: Secondary | ICD-10-CM | POA: Diagnosis not present

## 2021-12-12 HISTORY — DX: Malignant neoplasm of unspecified kidney, except renal pelvis: C64.9

## 2021-12-12 HISTORY — DX: Prediabetes: R73.03

## 2021-12-12 HISTORY — DX: Gastro-esophageal reflux disease without esophagitis: K21.9

## 2021-12-12 HISTORY — DX: Unspecified macular degeneration: H35.30

## 2021-12-12 LAB — CBC
HCT: 40.7 % (ref 36.0–46.0)
Hemoglobin: 13.2 g/dL (ref 12.0–15.0)
MCH: 27.4 pg (ref 26.0–34.0)
MCHC: 32.4 g/dL (ref 30.0–36.0)
MCV: 84.6 fL (ref 80.0–100.0)
Platelets: 278 10*3/uL (ref 150–400)
RBC: 4.81 MIL/uL (ref 3.87–5.11)
RDW: 14.1 % (ref 11.5–15.5)
WBC: 5.7 10*3/uL (ref 4.0–10.5)
nRBC: 0 % (ref 0.0–0.2)

## 2021-12-12 LAB — GLUCOSE, CAPILLARY: Glucose-Capillary: 119 mg/dL — ABNORMAL HIGH (ref 70–99)

## 2021-12-12 LAB — BASIC METABOLIC PANEL
Anion gap: 5 (ref 5–15)
BUN: 16 mg/dL (ref 8–23)
CO2: 24 mmol/L (ref 22–32)
Calcium: 9.2 mg/dL (ref 8.9–10.3)
Chloride: 109 mmol/L (ref 98–111)
Creatinine, Ser: 1.04 mg/dL — ABNORMAL HIGH (ref 0.44–1.00)
GFR, Estimated: 56 mL/min — ABNORMAL LOW (ref >60)
Glucose, Bld: 111 mg/dL — ABNORMAL HIGH (ref 70–99)
Potassium: 4.4 mmol/L (ref 3.5–5.1)
Sodium: 138 mmol/L (ref 135–145)

## 2021-12-12 LAB — HEMOGLOBIN A1C
Hgb A1c MFr Bld: 6.4 % — ABNORMAL HIGH (ref 4.8–5.6)
Mean Plasma Glucose: 136.98 mg/dL

## 2021-12-15 NOTE — H&P (Signed)
Office Visit Report     11/28/2021   --------------------------------------------------------------------------------   Gabriela Wallace  MRN: [id number]  DOB: Aug 17, 1945, 76 year old Female  SSN: [id number]   PRIMARY CARE:  Catalina Antigua, MD  REFERRING:  Raynelle Bring, Eduardo Osier  PROVIDER:  Raynelle Bring, M.D.  LOCATION:  [facility] [phone]     --------------------------------------------------------------------------------   CC/HPI: CC: Left renal cell carcinoma  Primary care physician: Dr. Oswald Hillock   Ms. Gabriela Wallace is a 76 year old female who initially presented to me last fall with a small centrally located left renal mass. She underwent an initial period of surveillance but was noted to have significant growth of her left-sided tumor from 2.2 cm up to 2.8 cm and with clear enhancement. This was centrally located and raise the question of possible urothelial carcinoma versus renal cell carcinoma. Since her last visit, she underwent cystoscopy with left retrograde pyelography but did not demonstrate a filling defect but rather extrinsic compression on the collecting system consistent with a probable renal parenchymal mass. 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 She was then noted to have bilateral pulmonary nodules concerning for possible metastatic disease She was referred to Dr. Alen Blew for evaluation who ordered a PET scan. The pulmonary nodules were not hypermetabolic and felt less likely to represent metastases. Therefore, she is here today to further discuss primary surgical treatment of her known left renal malignancy.   Family history of kidney cancer: None  Family history of ESRD: No   Imaging: CT scan in May 2023  Side of renal neoplasm: Left  Size of renal neoplasm: 2.8 cm  Location of renal neoplasm:  Centrally located in the perihilar region  Exophytic or endophytic: Completely endophytic  Renal nephrometry score: 10 xh   Renal artery anatomy: Single renal artery  Renal vein anatomy: Single renal vein   Contralateral renal lesions: None.  Regional lymphadenopathy: None.  Adrenal masses: None.  Renal vein/IVC involvement: No.  Metastatic disease to the abdomen: No.   Baseline renal function: Cr 1.0, eGFR > 60 ml/min   PMH: Past medical history is significant for arthritis, depression, gastroesophageal reflux disease, hyperlipidemia, and hypertension.  PSH: She has a lower midline incision from a prior hysterectomy.     ALLERGIES: No Allergies    MEDICATIONS: Hydrochlorothiazide 100 % powder  Baclofen 10 mg tablet  Klor-Con 10 10 meq tablet, extended release  Tramadol Hcl  Tylenol  Vitamin D2     GU PSH: Cystoscopy - 08/31/2021     NON-GU PSH: Rotator cuff surgery, Left     GU PMH: Renal cell carcinoma, left - 09/22/2021 Left renal neoplasm - 08/02/2021, - 05/02/2021      PMH Notes:   1) Left renal mass: She presented to me in January 2023 with a questionable 2.2 cm isodense lesion of the left kidney on CT imaging without contrast.   May 2023: CT with and without contrast    NON-GU PMH: Arthritis Depression GERD Hypercholesterolemia Hypertension    FAMILY HISTORY: 1 Daughter - Runs in Family 5 sons - Runs in Family   SOCIAL HISTORY: Marital Status: Widowed Preferred Language: English; Ethnicity: Not Hispanic Or Latino; Race: Black or African American Current Smoking Status: Patient has never smoked.   Tobacco Use Assessment Completed: Used Tobacco in last 30 days? Has never drank.  Drinks 3 caffeinated drinks per day.  REVIEW OF SYSTEMS:    GU Review Female:   Patient denies frequent urination, hard to postpone urination, burning /pain with urination, get up at night to urinate, leakage of urine, stream starts and stops, trouble starting your stream,  have to strain to urinate, and currently pregnant.  Gastrointestinal (Upper):   Patient denies nausea and vomiting.  Gastrointestinal (Lower):   Patient denies diarrhea and constipation.  Constitutional:   Patient denies fever, night sweats, weight loss, and fatigue.  Skin:   Patient denies skin rash/ lesion and itching.  Eyes:   Patient denies blurred vision and double vision.  Ears/ Nose/ Throat:   Patient denies sore throat and sinus problems.  Hematologic/Lymphatic:   Patient denies easy bruising and swollen glands.  Cardiovascular:   Patient denies leg swelling and chest pains.  Respiratory:   Patient denies cough and shortness of breath.  Endocrine:   Patient denies excessive thirst.  Musculoskeletal:   Patient denies back pain and joint pain.  Neurological:   Patient denies headaches and dizziness.  Psychologic:   Patient denies depression and anxiety.   VITAL SIGNS:      11/28/2021 11:21 AM  Weight 220 lb / 99.79 kg  Height 66 in / 167.64 cm  BP 153/81 mmHg  Pulse 69 /min  Temperature 98.2 F / 36.7 C  BMI 35.5 kg/m   MULTI-SYSTEM PHYSICAL EXAMINATION:    Constitutional: Well-nourished. No physical deformities. Normally developed. Good grooming.  Neck: Neck symmetrical, not swollen. Normal tracheal position.  Respiratory: No labored breathing, no use of accessory muscles. Clear bilaterally.  Cardiovascular: Normal temperature, normal extremity pulses, no swelling, no varicosities. Regular rate and rhythm.  Lymphatic: No enlargement of neck, axillae, groin.  Skin: No paleness, no jaundice, no cyanosis. No lesion, no ulcer, no rash.  Neurologic / Psychiatric: Oriented to time, oriented to place, oriented to person. No depression, no anxiety, no agitation.  Gastrointestinal: Obese, no masses. Well-healed lower midline incision.  Eyes: Normal conjunctivae. Normal eyelids.  Ears, Nose, Mouth, and Throat: Left ear no scars, no lesions, no masses. Right ear no scars, no lesions, no  masses. Nose no scars, no lesions, no masses. Normal hearing. Normal lips.  Musculoskeletal: Normal gait and station of head and neck.     Complexity of Data:  Records Review:   Previous Patient Records  X-Ray Review: PET Scan: Reviewed Films.  C.T. Abdomen/Pelvis: Reviewed Films.    Notes:                     CLINICAL DATA: Abdominal pain, RIGHT lower flank pain for 4-5 days,  question kidney stone   EXAM:  CT ABDOMEN AND PELVIS WITHOUT CONTRAST   TECHNIQUE:  Multidetector CT imaging of the abdomen and pelvis was performed  following the standard protocol without IV contrast. No oral  contrast administered.   RADIATION DOSE REDUCTION: This exam was performed according to the  departmental dose-optimization program which includes automated  exposure control, adjustment of the mA and/or kV according to  patient size and/or use of iterative reconstruction technique.   COMPARISON: 07/24/2021   FINDINGS:  Lower chest: Lung bases clear   Hepatobiliary: Gallbladder and liver normal appearance   Pancreas: Normal appearance   Spleen: Normal appearance   Adrenals/Urinary Tract: Adrenal glands, kidneys, ureters, and  bladder normal appearance. No urinary tract calcification or  dilatation.   Stomach/Bowel: Normal appendix. Stomach and bowel loops normal  appearance.   Vascular/Lymphatic: Scattered atherosclerotic calcifications aorta  and iliac  arteries without aneurysm. No adenopathy.   Reproductive: Unremarkable uterus and ovaries   Other: No free air or free fluid. No hernia or inflammatory process.   Musculoskeletal: Degenerative disc and facet disease changes lumbar  spine.   IMPRESSION:  No acute intra-abdominal or intrapelvic abnormalities.   Aortic Atherosclerosis (ICD10-I70.0).    Electronically Signed  By: Lavonia Dana M.D.  On: 09/30/2021 11:58   CLINICAL DATA: Initial treatment strategy for clear cell renal cell  carcinoma. Pulmonary nodule seen on  recent chest CT.Marland Kitchen   EXAM:  NUCLEAR MEDICINE PET SKULL BASE TO THIGH   TECHNIQUE:  12.4 mCi F-18 FDG was injected intravenously. Full-ring PET imaging  was performed from the skull base to thigh after the radiotracer. CT  data was obtained and used for attenuation correction and anatomic  localization.   Fasting blood glucose: 104 mg/dl   COMPARISON: Multiple previous imaging studies. The most recent  abdominal CT scan is 09/30/2021. Recent chest CT 10/04/2021   FINDINGS:  Mediastinal blood pool activity: SUV max 2.61   Liver activity: SUV max NA   NECK: No hypermetabolic lymph nodes in the neck.   Incidental CT findings: None.   CHEST: Scattered solid and subsolid pulmonary nodules in both lungs.  The largest lesion is a ground-glass opacity in the left upper lobe  which measures 13 mm. No hypermetabolism is demonstrated but this  will need follow-up as it certainly could be a slow growing  adenocarcinoma. All of the other nodules are below the limits of  PET-CT for sensitivity. The small nodule at the right lung base was  present on a prior abdominal CT scan from 2022.   No hypermetabolic breast masses, supraclavicular or axillary  adenopathy. Left thyroid goiter with largest nodule measuring 12 mm.  No hypermetabolism on the PET scan. Not clinically significant; no  follow-up imaging recommended (Ref: J Am Coll Radiol. 2015  Feb;12(2): 143-50). No enlarged or hypermetabolic mediastinal or  hilar lymph nodes. Small scattered nodes are likely reactive.   Incidental CT findings: Calcifications noted around the aortic  valve. Scattered coronary artery calcifications.   ABDOMEN/PELVIS: The left renal mass is hypermetabolic and consistent  with known renal cell carcinoma. SUV max is 14.0. No enlarged or  hypermetabolic retroperitoneal adenopathy. No hepatic or adrenal  gland lesions.   Small focus of hypermetabolism associated with the left ovary has an  SUV max of 4.08.  No discrete lesion is seen on the CT scan.  Recommend pelvic ultrasound.   Incidental CT findings: Scattered vascular calcifications. No  aneurysm.   SKELETON: No findings suspicious for osseous metastatic disease.   Incidental CT findings: Advanced degenerative changes involving both  shoulders.   IMPRESSION:  1. Known left renal neoplasm is hypermetabolic. No findings for  abdominal metastatic disease.  2. Scattered pulmonary nodules as detailed above. I think it is  unlikely this is metastatic disease. Recommend 3-6 month CT  follow-up.  3. Small focus of hypermetabolism associated with the left ovary. No  obvious lesion on any of the prior imaging studies. Recommend pelvic  ultrasound.    Electronically Signed  By: Marijo Sanes M.D.  On: 11/10/2021 14:02   PROCEDURES: None   ASSESSMENT:      ICD-10 Details  1 GU:   Renal cell carcinoma, left - C64.2    PLAN:           Schedule Return Visit/Planned Activity: Other See Visit Notes  Note: Will call to schedule surgery.          Document Letter(s):  Created for Patient: Clinical Summary         Notes:   1. Renal cell carcinoma of the left kidney: At this time, is felt that her pulmonary nodules are not likely malignant based on her recent PET scan imaging. We therefore discussed that she likely has an aggressive but localized left renal cell carcinoma. Although this mass is not particularly large, it is centrally located and will require a left laparoscopic radical nephrectomy.   The patient was provided information regarding their renal mass including the relative risk of benign versus malignant pathology and the natural history of renal cell carcinoma and other possible malignancies of the kidney. The role of renal biopsy, laboratory testing, and imaging studies to further characterize renal masses and/or the presence of metastatic disease were explained. We discussed the role of active surveillance, surgical  therapy with both radical nephrectomy and nephron-sparing surgery, and ablative therapy in the treatment of renal masses. In addition, we discussed our goals of providing an accurate diagnosis and oncologic control while maintaining optimal renal function as appropriate based on the size, location, and complexity of their renal mass as well as their co-morbidities.  We have discussed the risks of treatment in detail including but not limited to bleeding, infection, heart attack, stroke, death, venothromoboembolism, cancer recurrence, injury/damage to surrounding organs and structures, urine leak, the possibility of open surgical conversion for patients undergoing minimally invasive surgery, the risk of developing chronic kidney disease and its associated implications, and the potential risk of end stage renal disease possibly necessitating dialysis.   She will be scheduled for a left laparoscopic radical nephrectomy in the near future.   CC: Dr. Zola Button  Dr. Catalina Antigua    * Signed by Raynelle Bring, M.D. on 11/28/21 at 8:44 PM (EDT)*

## 2021-12-17 ENCOUNTER — Encounter (HOSPITAL_COMMUNITY): Payer: Self-pay | Admitting: Urology

## 2021-12-17 NOTE — Anesthesia Preprocedure Evaluation (Signed)
Anesthesia Evaluation  Patient identified by MRN, date of birth, ID band Patient awake    Reviewed: Allergy & Precautions, NPO status , Patient's Chart, lab work & pertinent test results, reviewed documented beta blocker date and time   Airway Mallampati: II  TM Distance: >3 FB     Dental  (+) Upper Dentures, Partial Lower, Dental Advisory Given, Caps   Pulmonary neg pulmonary ROS,    Pulmonary exam normal breath sounds clear to auscultation       Cardiovascular hypertension, Pt. on medications Normal cardiovascular exam Rhythm:Regular Rate:Normal     Neuro/Psych Seizures -, Well Controlled,  PSYCHIATRIC DISORDERS Anxiety Depression Peripheral neuropathy Macular degeneration    GI/Hepatic Neg liver ROS, GERD  Medicated,  Endo/Other  Morbid obesity  Renal/GU Renal InsufficiencyRenal diseaseLeft renal cell carcinoma  negative genitourinary   Musculoskeletal  (+) Arthritis , Osteoarthritis,    Abdominal (+) + obese,   Peds  Hematology negative hematology ROS (+)   Anesthesia Other Findings   Reproductive/Obstetrics                          Anesthesia Physical Anesthesia Plan  ASA: 3  Anesthesia Plan: General   Post-op Pain Management: Tylenol PO (pre-op)*, Precedex, Dilaudid IV and Gabapentin PO (pre-op)*   Induction:   PONV Risk Score and Plan: 4 or greater and Treatment may vary due to age or medical condition, Ondansetron and Dexamethasone  Airway Management Planned: Oral ETT  Additional Equipment: Arterial line  Intra-op Plan:   Post-operative Plan: Extubation in OR  Informed Consent: I have reviewed the patients History and Physical, chart, labs and discussed the procedure including the risks, benefits and alternatives for the proposed anesthesia with the patient or authorized representative who has indicated his/her understanding and acceptance.     Dental advisory  given  Plan Discussed with: CRNA and Anesthesiologist  Anesthesia Plan Comments:        Anesthesia Quick Evaluation

## 2021-12-18 ENCOUNTER — Inpatient Hospital Stay (HOSPITAL_COMMUNITY): Payer: Medicare HMO | Admitting: Certified Registered"

## 2021-12-18 ENCOUNTER — Inpatient Hospital Stay (HOSPITAL_COMMUNITY)
Admission: RE | Admit: 2021-12-18 | Discharge: 2021-12-20 | DRG: 657 | Disposition: A | Discharge status: Home / Self Care | Payer: Medicare HMO | Attending: Urology | Admitting: Urology

## 2021-12-18 ENCOUNTER — Encounter (HOSPITAL_COMMUNITY): Payer: Self-pay | Admitting: Urology

## 2021-12-18 ENCOUNTER — Other Ambulatory Visit: Payer: Self-pay

## 2021-12-18 ENCOUNTER — Other Ambulatory Visit (HOSPITAL_COMMUNITY): Payer: Self-pay

## 2021-12-18 ENCOUNTER — Encounter (HOSPITAL_COMMUNITY)
Admission: RE | Disposition: A | Discharge status: Home / Self Care | Payer: Self-pay | Source: Home / Self Care | Attending: Urology

## 2021-12-18 DIAGNOSIS — F418 Other specified anxiety disorders: Secondary | ICD-10-CM | POA: Diagnosis present

## 2021-12-18 DIAGNOSIS — E785 Hyperlipidemia, unspecified: Secondary | ICD-10-CM | POA: Diagnosis present

## 2021-12-18 DIAGNOSIS — K219 Gastro-esophageal reflux disease without esophagitis: Secondary | ICD-10-CM | POA: Diagnosis present

## 2021-12-18 DIAGNOSIS — I10 Essential (primary) hypertension: Secondary | ICD-10-CM

## 2021-12-18 DIAGNOSIS — M1990 Unspecified osteoarthritis, unspecified site: Secondary | ICD-10-CM

## 2021-12-18 DIAGNOSIS — Z6841 Body Mass Index (BMI) 40.0 and over, adult: Secondary | ICD-10-CM | POA: Diagnosis not present

## 2021-12-18 DIAGNOSIS — H3530 Unspecified macular degeneration: Secondary | ICD-10-CM | POA: Diagnosis present

## 2021-12-18 DIAGNOSIS — E7800 Pure hypercholesterolemia, unspecified: Secondary | ICD-10-CM | POA: Diagnosis present

## 2021-12-18 DIAGNOSIS — C642 Malignant neoplasm of left kidney, except renal pelvis: Secondary | ICD-10-CM | POA: Diagnosis present

## 2021-12-18 DIAGNOSIS — R7303 Prediabetes: Principal | ICD-10-CM

## 2021-12-18 DIAGNOSIS — F32A Depression, unspecified: Secondary | ICD-10-CM | POA: Diagnosis present

## 2021-12-18 HISTORY — PX: LAPAROSCOPIC NEPHRECTOMY: SHX1930

## 2021-12-18 LAB — GLUCOSE, CAPILLARY: Glucose-Capillary: 103 mg/dL — ABNORMAL HIGH (ref 70–99)

## 2021-12-18 LAB — BASIC METABOLIC PANEL
Anion gap: 6 (ref 5–15)
BUN: 14 mg/dL (ref 8–23)
CO2: 24 mmol/L (ref 22–32)
Calcium: 8.5 mg/dL — ABNORMAL LOW (ref 8.9–10.3)
Chloride: 103 mmol/L (ref 98–111)
Creatinine, Ser: 1.68 mg/dL — ABNORMAL HIGH (ref 0.44–1.00)
GFR, Estimated: 31 mL/min — ABNORMAL LOW (ref >60)
Glucose, Bld: 353 mg/dL — ABNORMAL HIGH (ref 70–99)
Potassium: 4.8 mmol/L (ref 3.5–5.1)
Sodium: 133 mmol/L — ABNORMAL LOW (ref 135–145)

## 2021-12-18 LAB — HEMOGLOBIN AND HEMATOCRIT, BLOOD
HCT: 36.4 % (ref 36.0–46.0)
Hemoglobin: 11.6 g/dL — ABNORMAL LOW (ref 12.0–15.0)

## 2021-12-18 LAB — TYPE AND SCREEN
ABO/RH(D): B POS
Antibody Screen: NEGATIVE

## 2021-12-18 LAB — ABO/RH: ABO/RH(D): B POS

## 2021-12-18 SURGERY — NEPHRECTOMY, RADICAL, LAPAROSCOPIC, ADULT
Anesthesia: General | Laterality: Left

## 2021-12-18 MED ORDER — PROPOFOL 10 MG/ML IV BOLUS
INTRAVENOUS | Status: AC
  Filled 2021-12-18: qty 20

## 2021-12-18 MED ORDER — ORAL CARE MOUTH RINSE: 15.0000 mL | Freq: Once | OROMUCOSAL | Status: DC

## 2021-12-18 MED ORDER — CEFAZOLIN SODIUM-DEXTROSE 2-4 GM/100ML-% IV SOLN
2.0000 g | Freq: Once | INTRAVENOUS | Status: AC
  Administered 2021-12-18: 2 g via INTRAVENOUS
  Filled 2021-12-18: qty 100

## 2021-12-18 MED ORDER — PHENYLEPHRINE 80 MCG/ML (10ML) SYRINGE FOR IV PUSH (FOR BLOOD PRESSURE SUPPORT)
PREFILLED_SYRINGE | INTRAVENOUS | Status: DC | PRN
  Administered 2021-12-18 (×4): 40 ug via INTRAVENOUS

## 2021-12-18 MED ORDER — CEFAZOLIN SODIUM-DEXTROSE 1-4 GM/50ML-% IV SOLN
1.0000 g | Freq: Three times a day (TID) | INTRAVENOUS | Status: AC
  Administered 2021-12-18 – 2021-12-19 (×2): 1 g via INTRAVENOUS
  Filled 2021-12-18 (×2): qty 50

## 2021-12-18 MED ORDER — TRAMADOL HCL 50 MG PO TABS
50.0000 mg | ORAL_TABLET | Freq: Four times a day (QID) | ORAL | 0 refills | Status: DC | PRN
  Filled 2021-12-18: qty 20, 3d supply, fill #0

## 2021-12-18 MED ORDER — BUPIVACAINE-EPINEPHRINE (PF) 0.25% -1:200000 IJ SOLN
INTRAMUSCULAR | Status: AC
  Filled 2021-12-18: qty 30

## 2021-12-18 MED ORDER — SODIUM CHLORIDE (PF) 0.9 % IJ SOLN
INTRAMUSCULAR | Status: DC | PRN
  Administered 2021-12-18: 20 mL

## 2021-12-18 MED ORDER — AMISULPRIDE (ANTIEMETIC) 5 MG/2ML IV SOLN: 10.0000 mg | Freq: Once | INTRAVENOUS | Status: DC | PRN

## 2021-12-18 MED ORDER — DIPHENHYDRAMINE HCL 12.5 MG/5ML PO ELIX: 12.5000 mg | ORAL_SOLUTION | Freq: Four times a day (QID) | ORAL | Status: DC | PRN

## 2021-12-18 MED ORDER — DOCUSATE SODIUM 100 MG PO CAPS: 100.0000 mg | ORAL_CAPSULE | Freq: Two times a day (BID) | ORAL | Status: DC

## 2021-12-18 MED ORDER — LACTATED RINGERS IV SOLN: INTRAVENOUS | Status: DC

## 2021-12-18 MED ORDER — PHENYLEPHRINE HCL-NACL 20-0.9 MG/250ML-% IV SOLN
INTRAVENOUS | Status: DC | PRN
  Administered 2021-12-18: 10 ug/min via INTRAVENOUS

## 2021-12-18 MED ORDER — ROCURONIUM BROMIDE 10 MG/ML (PF) SYRINGE
PREFILLED_SYRINGE | INTRAVENOUS | Status: DC | PRN
  Administered 2021-12-18: 20 mg via INTRAVENOUS
  Administered 2021-12-18: 100 mg via INTRAVENOUS

## 2021-12-18 MED ORDER — BUPIVACAINE LIPOSOME 1.3 % IJ SUSP
INTRAMUSCULAR | Status: DC | PRN
  Administered 2021-12-18: 20 mL

## 2021-12-18 MED ORDER — HYDROMORPHONE HCL 1 MG/ML IJ SOLN
INTRAMUSCULAR | Status: AC
  Administered 2021-12-18: 0.5 mg via INTRAVENOUS
  Filled 2021-12-18: qty 1

## 2021-12-18 MED ORDER — HYDROCHLOROTHIAZIDE 25 MG PO TABS
25.0000 mg | ORAL_TABLET | Freq: Every morning | ORAL | Status: DC
  Administered 2021-12-18 – 2021-12-20 (×3): 25 mg via ORAL
  Filled 2021-12-18 (×3): qty 1

## 2021-12-18 MED ORDER — CITALOPRAM HYDROBROMIDE 20 MG PO TABS
10.0000 mg | ORAL_TABLET | Freq: Every day | ORAL | Status: DC
  Administered 2021-12-18 – 2021-12-20 (×3): 10 mg via ORAL
  Filled 2021-12-18 (×3): qty 1

## 2021-12-18 MED ORDER — PANTOPRAZOLE SODIUM 20 MG PO TBEC
20.0000 mg | DELAYED_RELEASE_TABLET | Freq: Every day | ORAL | Status: DC
  Administered 2021-12-18 – 2021-12-20 (×3): 20 mg via ORAL
  Filled 2021-12-18 (×3): qty 1

## 2021-12-18 MED ORDER — HYDROMORPHONE HCL 1 MG/ML IJ SOLN: 0.2500 mg | INTRAMUSCULAR | Status: DC | PRN

## 2021-12-18 MED ORDER — SUGAMMADEX SODIUM 200 MG/2ML IV SOLN
INTRAVENOUS | Status: DC | PRN
  Administered 2021-12-18: 250 mg via INTRAVENOUS

## 2021-12-18 MED ORDER — GABAPENTIN 100 MG PO CAPS
200.0000 mg | ORAL_CAPSULE | Freq: Once | ORAL | Status: AC
  Administered 2021-12-18: 200 mg via ORAL
  Filled 2021-12-18: qty 2

## 2021-12-18 MED ORDER — ONDANSETRON HCL 4 MG/2ML IJ SOLN
INTRAMUSCULAR | Status: DC | PRN
  Administered 2021-12-18: 4 mg via INTRAVENOUS

## 2021-12-18 MED ORDER — ACETAMINOPHEN 500 MG PO TABS
1000.0000 mg | ORAL_TABLET | Freq: Once | ORAL | Status: AC
  Administered 2021-12-18: 1000 mg via ORAL
  Filled 2021-12-18: qty 2

## 2021-12-18 MED ORDER — ONDANSETRON HCL 4 MG/2ML IJ SOLN
INTRAMUSCULAR | Status: AC
  Filled 2021-12-18: qty 2

## 2021-12-18 MED ORDER — SUGAMMADEX SODIUM 500 MG/5ML IV SOLN
INTRAVENOUS | Status: AC
  Filled 2021-12-18: qty 5

## 2021-12-18 MED ORDER — PROPOFOL 10 MG/ML IV BOLUS
INTRAVENOUS | Status: DC | PRN
  Administered 2021-12-18: 150 mg via INTRAVENOUS

## 2021-12-18 MED ORDER — FENTANYL CITRATE (PF) 250 MCG/5ML IJ SOLN
INTRAMUSCULAR | Status: DC | PRN
  Administered 2021-12-18: 50 ug via INTRAVENOUS
  Administered 2021-12-18: 100 ug via INTRAVENOUS
  Administered 2021-12-18: 50 ug via INTRAVENOUS

## 2021-12-18 MED ORDER — SODIUM CHLORIDE (PF) 0.9 % IJ SOLN
INTRAMUSCULAR | Status: AC
  Filled 2021-12-18: qty 20

## 2021-12-18 MED ORDER — FENTANYL CITRATE (PF) 100 MCG/2ML IJ SOLN
INTRAMUSCULAR | Status: AC
  Filled 2021-12-18: qty 2

## 2021-12-18 MED ORDER — CHLORHEXIDINE GLUCONATE 0.12 % MT SOLN: 15.0000 mL | Freq: Once | OROMUCOSAL | Status: DC

## 2021-12-18 MED ORDER — DIPHENHYDRAMINE HCL 50 MG/ML IJ SOLN: 12.5000 mg | Freq: Four times a day (QID) | INTRAMUSCULAR | Status: DC | PRN

## 2021-12-18 MED ORDER — HYDROMORPHONE HCL 1 MG/ML IJ SOLN
0.5000 mg | INTRAMUSCULAR | Status: DC | PRN
  Administered 2021-12-18 (×4): 1 mg via INTRAVENOUS
  Filled 2021-12-18 (×4): qty 1

## 2021-12-18 MED ORDER — DEXTROSE-NACL 5-0.45 % IV SOLN: INTRAVENOUS | Status: DC

## 2021-12-18 MED ORDER — DEXAMETHASONE SODIUM PHOSPHATE 10 MG/ML IJ SOLN
INTRAMUSCULAR | Status: DC | PRN
  Administered 2021-12-18: 4 mg via INTRAVENOUS

## 2021-12-18 MED ORDER — DOCUSATE SODIUM 100 MG PO CAPS
100.0000 mg | ORAL_CAPSULE | Freq: Two times a day (BID) | ORAL | Status: DC
  Administered 2021-12-18 – 2021-12-20 (×4): 100 mg via ORAL
  Filled 2021-12-18 (×4): qty 1

## 2021-12-18 MED ORDER — BUPIVACAINE LIPOSOME 1.3 % IJ SUSP
INTRAMUSCULAR | Status: AC
  Filled 2021-12-18: qty 20

## 2021-12-18 MED ORDER — ONDANSETRON HCL 4 MG/2ML IJ SOLN: 4.0000 mg | INTRAMUSCULAR | Status: DC | PRN

## 2021-12-18 MED ORDER — LACTATED RINGERS IR SOLN
Status: DC | PRN
  Administered 2021-12-18: 1000 mL

## 2021-12-18 MED ORDER — LACTATED RINGERS IV SOLN: INTRAVENOUS | Status: DC | PRN

## 2021-12-18 MED ORDER — ROCURONIUM BROMIDE 10 MG/ML (PF) SYRINGE
PREFILLED_SYRINGE | INTRAVENOUS | Status: AC
  Filled 2021-12-18: qty 10

## 2021-12-18 MED ORDER — HYDROMORPHONE HCL 1 MG/ML IJ SOLN
0.2500 mg | INTRAMUSCULAR | Status: DC | PRN
  Administered 2021-12-18 (×2): 0.5 mg via INTRAVENOUS

## 2021-12-18 MED ORDER — PHENYLEPHRINE 80 MCG/ML (10ML) SYRINGE FOR IV PUSH (FOR BLOOD PRESSURE SUPPORT)
PREFILLED_SYRINGE | INTRAVENOUS | Status: AC
  Filled 2021-12-18: qty 10

## 2021-12-18 MED ORDER — LIDOCAINE 2% (20 MG/ML) 5 ML SYRINGE
INTRAMUSCULAR | Status: DC | PRN
  Administered 2021-12-18: 100 mg via INTRAVENOUS

## 2021-12-18 MED ORDER — DEXAMETHASONE SODIUM PHOSPHATE 10 MG/ML IJ SOLN
INTRAMUSCULAR | Status: AC
  Filled 2021-12-18: qty 1

## 2021-12-18 MED ORDER — ONDANSETRON HCL 4 MG/2ML IJ SOLN: 4.0000 mg | Freq: Once | INTRAMUSCULAR | Status: DC | PRN

## 2021-12-18 MED ORDER — ACETAMINOPHEN 10 MG/ML IV SOLN
1000.0000 mg | Freq: Four times a day (QID) | INTRAVENOUS | Status: AC
  Administered 2021-12-18 – 2021-12-19 (×4): 1000 mg via INTRAVENOUS
  Filled 2021-12-18 (×4): qty 100

## 2021-12-18 MED ORDER — BACLOFEN 10 MG PO TABS
10.0000 mg | ORAL_TABLET | Freq: Three times a day (TID) | ORAL | Status: DC | PRN
  Administered 2021-12-19: 10 mg via ORAL
  Filled 2021-12-18: qty 1

## 2021-12-18 SURGICAL SUPPLY — 46 items
BAG COUNTER SPONGE SURGICOUNT (BAG) IMPLANT
BAG LAPAROSCOPIC 12 15 PORT 16 (BASKET) ×1 IMPLANT
BAG RETRIEVAL 12/15 (BASKET) ×1
BAG ZIPLOCK 12X15 (MISCELLANEOUS) ×1 IMPLANT
BLADE EXTENDED COATED 6.5IN (ELECTRODE) IMPLANT
CHLORAPREP W/TINT 26 (MISCELLANEOUS) ×1 IMPLANT
CLIP LIGATING HEM O LOK PURPLE (MISCELLANEOUS) ×1 IMPLANT
CLIP LIGATING HEMO LOK XL GOLD (MISCELLANEOUS) IMPLANT
CLIP LIGATING HEMO O LOK GREEN (MISCELLANEOUS) IMPLANT
COVER SURGICAL LIGHT HANDLE (MISCELLANEOUS) ×1 IMPLANT
CUTTER FLEX LINEAR 45M (STAPLE) IMPLANT
DERMABOND ADVANCED .7 DNX12 (GAUZE/BANDAGES/DRESSINGS) ×1 IMPLANT
DRAPE INCISE IOBAN 66X45 STRL (DRAPES) ×1 IMPLANT
DRAPE LAPAROSCOPIC ABDOMINAL (DRAPES) IMPLANT
DRAPE WARM FLUID 44X44 (DRAPES) IMPLANT
ELECT PENCIL ROCKER SW 15FT (MISCELLANEOUS) ×1 IMPLANT
ELECT REM PT RETURN 15FT ADLT (MISCELLANEOUS) ×1 IMPLANT
GLOVE BIO SURGEON STRL SZ 6.5 (GLOVE) ×1 IMPLANT
GLOVE SURG LX STRL 7.5 STRW (GLOVE) ×1 IMPLANT
GOWN STRL REUS W/ TWL XL LVL3 (GOWN DISPOSABLE) ×1 IMPLANT
GOWN STRL REUS W/TWL XL LVL3 (GOWN DISPOSABLE) ×1
HEMOSTAT SURGICEL 4X8 (HEMOSTASIS) IMPLANT
HOLDER FOLEY CATH W/STRAP (MISCELLANEOUS) ×1 IMPLANT
IRRIG SUCT STRYKERFLOW 2 WTIP (MISCELLANEOUS) ×1
IRRIGATION SUCT STRKRFLW 2 WTP (MISCELLANEOUS) ×1 IMPLANT
KIT BASIN OR (CUSTOM PROCEDURE TRAY) ×1 IMPLANT
KIT TURNOVER KIT A (KITS) IMPLANT
RELOAD 45 VASCULAR/THIN (ENDOMECHANICALS) ×1 IMPLANT
RELOAD STAPLE 45 2.5 WHT GRN (ENDOMECHANICALS) IMPLANT
SCISSORS LAP 5X35 DISP (ENDOMECHANICALS) IMPLANT
SET TUBE SMOKE EVAC HIGH FLOW (TUBING) IMPLANT
SHEARS HARMONIC ACE PLUS 36CM (ENDOMECHANICALS) ×1 IMPLANT
SPONGE T-LAP 18X18 ~~LOC~~+RFID (SPONGE) ×1 IMPLANT
SURGIFLO W/THROMBIN 8M KIT (HEMOSTASIS) IMPLANT
SUT MNCRL AB 4-0 PS2 18 (SUTURE) ×2 IMPLANT
SUT PDS AB 1 CT1 27 (SUTURE) IMPLANT
SUT VIC AB 2-0 SH 27 (SUTURE) ×1
SUT VIC AB 2-0 SH 27X BRD (SUTURE) IMPLANT
SUT VICRYL 0 UR6 27IN ABS (SUTURE) ×1 IMPLANT
TOWEL OR 17X26 10 PK STRL BLUE (TOWEL DISPOSABLE) ×1 IMPLANT
TOWEL OR NON WOVEN STRL DISP B (DISPOSABLE) ×1 IMPLANT
TRAY FOLEY MTR SLVR 16FR STAT (SET/KITS/TRAYS/PACK) ×1 IMPLANT
TRAY LAPAROSCOPIC (CUSTOM PROCEDURE TRAY) ×1 IMPLANT
TROCAR ADV FIXATION 12X100MM (TROCAR) ×1 IMPLANT
TROCAR BALLN 12MMX100 BLUNT (TROCAR) ×1 IMPLANT
TROCAR Z-THREAD OPTICAL 5X100M (TROCAR) ×1 IMPLANT

## 2021-12-18 NOTE — Anesthesia Procedure Notes (Signed)
Procedure Name: Intubation Date/Time: 12/18/2021 7:41 AM  Performed by: Eben Burow, CRNAPre-anesthesia Checklist: Patient identified, Emergency Drugs available, Suction available, Patient being monitored and Timeout performed Patient Re-evaluated:Patient Re-evaluated prior to induction Oxygen Delivery Method: Circle system utilized Preoxygenation: Pre-oxygenation with 100% oxygen Induction Type: IV induction Ventilation: Mask ventilation without difficulty Laryngoscope Size: Mac and 4 Grade View: Grade I Tube type: Oral Tube size: 7.0 mm Number of attempts: 1 Airway Equipment and Method: Stylet Placement Confirmation: ETT inserted through vocal cords under direct vision, positive ETCO2 and breath sounds checked- equal and bilateral Secured at: 21 cm Tube secured with: Tape Dental Injury: Teeth and Oropharynx as per pre-operative assessment

## 2021-12-18 NOTE — Anesthesia Procedure Notes (Signed)
Arterial Line Insertion Start/End9/18/2023 7:54 AM, 12/18/2021 7:54 AM Performed by: Josephine Igo, MD, Javen Hinderliter, CRNA, CRNA  Patient location: Pre-op. Preanesthetic checklist: patient identified, IV checked, site marked, risks and benefits discussed, surgical consent, monitors and equipment checked, pre-op evaluation, timeout performed and anesthesia consent Patient sedated Left, radial was placed Catheter size: 20 G Hand hygiene performed  and maximum sterile barriers used  Allen's test indicative of satisfactory collateral circulation Attempts: 1 Procedure performed without using ultrasound guided technique. Following insertion, dressing applied and Biopatch. Post procedure assessment: normal and unchanged  Patient tolerated the procedure well with no immediate complications.

## 2021-12-18 NOTE — Progress Notes (Addendum)
[name], female   DOB: [birth year], 76 y.o.   MRN: [id number]  Post-op note  Subjective: The patient is sleeping comfortably.  Labs were not drawn in PACU and have been re-ordered.  Objective: Vital signs in last 24 hours: Temp:  [97.6 F (36.4 C)-98.5 F (36.9 C)] 98 F (36.7 C) (09/18 1045) Pulse Rate:  [63-68] 63 (09/18 1028) Resp:  [14-21] 20 (09/18 1045) BP: (131-155)/(71-93) 154/85 (09/18 1045) SpO2:  [96 %-100 %] 100 % (09/18 1045) Arterial Line BP: (161-164)/(65-69) 164/65 (09/18 1000) Weight:  [112.6 kg] 112.6 kg (09/18 0642)  Intake/Output from previous day: No intake/output data recorded. Intake/Output this shift: Total I/O In: 1334.1 [I.V.:1234.1; IV Piggyback:100] Out: 250 [Urine:200; Blood:50]  Physical Exam:  General: Alert and oriented. Abdomen: Soft, Nondistended. Incisions: Clean and dry.  Lab Results: No results for input(s): "HGB", "HCT" in the last 72 hours.  Assessment/Plan: POD#0   1) Continue to monitor, ambulate, IS   Gabriela Wallace. MD   LOS: 0 days   Gabriela Wallace 12/18/2021, 2:12 PM

## 2021-12-18 NOTE — Transfer of Care (Signed)
Immediate Anesthesia Transfer of Care Note  Patient: Gabriela Wallace  Procedure(s) Performed: LAPAROSCOPIC RADICAL NEPHRECTOMY (Left)  Patient Location: PACU  Anesthesia Type:General  Level of Consciousness: awake, alert  and patient cooperative  Airway & Oxygen Therapy: Patient Spontanous Breathing and Patient connected to face mask oxygen  Post-op Assessment: Report given to RN and Post -op Vital signs reviewed and stable  Post vital signs: Reviewed and stable  Last Vitals:  Vitals Value Taken Time  BP 143/80 12/18/21 0923  Temp    Pulse 60 12/18/21 0926  Resp 13 12/18/21 0926  SpO2 100 % 12/18/21 0926  Vitals shown include unvalidated device data.  Last Pain:  Vitals:   12/18/21 0618  TempSrc: Oral         Complications: No notable events documented.

## 2021-12-18 NOTE — Plan of Care (Signed)
  Problem: Education: Goal: Knowledge of the prescribed therapeutic regimen will improve Outcome: Progressing   Problem: Education: Goal: Knowledge of General Education information will improve Description: Including pain rating scale, medication(s)/side effects and non-pharmacologic comfort measures Outcome: Progressing   Problem: Activity: Goal: Risk for activity intolerance will decrease Outcome: Progressing   Problem: Coping: Goal: Level of anxiety will decrease Outcome: Progressing   Problem: Elimination: Goal: Will not experience complications related to urinary retention Outcome: Progressing   Problem: Pain Managment: Goal: General experience of comfort will improve Outcome: Progressing   Problem: Safety: Goal: Ability to remain free from injury will improve Outcome: Progressing   Problem: Skin Integrity: Goal: Risk for impaired skin integrity will decrease Outcome: Progressing

## 2021-12-18 NOTE — Anesthesia Postprocedure Evaluation (Signed)
Anesthesia Post Note  Patient: Gabriela Wallace  Procedure(s) Performed: LAPAROSCOPIC RADICAL NEPHRECTOMY (Left)     Patient location during evaluation: PACU Anesthesia Type: General Level of consciousness: awake and alert and oriented Pain management: pain level controlled Vital Signs Assessment: post-procedure vital signs reviewed and stable Respiratory status: spontaneous breathing, nonlabored ventilation, respiratory function stable and patient connected to nasal cannula oxygen Cardiovascular status: blood pressure returned to baseline and stable Postop Assessment: no apparent nausea or vomiting Anesthetic complications: no   No notable events documented.  Last Vitals:  Vitals:   12/18/21 0945 12/18/21 1000  BP: 133/77 139/71  Pulse: 63 66  Resp: 16 20  Temp:    SpO2: 100% 98%    Last Pain:  Vitals:   12/18/21 0955  TempSrc:   PainSc: 9                  Kenzo Ozment A.

## 2021-12-18 NOTE — Interval H&P Note (Signed)
History and Physical Interval Note:  12/18/2021 6:59 AM  Gabriela Wallace  has presented today for surgery, with the diagnosis of LEFT RENAL CELL CARCINOMA.  The various methods of treatment have been discussed with the patient and family. After consideration of risks, benefits and other options for treatment, the patient has consented to  Procedure(s) with comments: LAPAROSCOPIC RADICAL NEPHRECTOMY (Left) - ONLY NEEDS 180 MIN as a surgical intervention.  The patient's history has been reviewed, patient examined, no change in status, stable for surgery.  I have reviewed the patient's chart and labs.  Questions were answered to the patient's satisfaction.     [employer]

## 2021-12-18 NOTE — Addendum Note (Signed)
Addendum  created 12/18/21 1323 by Eben Burow, CRNA   Charge Capture section accepted, Visit diagnoses modified

## 2021-12-18 NOTE — Op Note (Signed)
Preoperative diagnosis: Left renal cell carcinoma  Postoperative diagnosis: Left renal cell carcinoma  Procedure:  Left laparoscopic radical nephrectomy  Surgeon: Pryor Curia. M.D.  Assistant(s): Debbrah Alar, PA-C  Anesthesia: General  Complications: None  EBL: 50 mL  IVF:  1200 mL crystalloid  Specimens: Left kidney  Disposition of specimens: Pathology  Indication: Gabriela Wallace is a 76 y.o. patient with a left renal tumor that was biopsy-proven to be renal cell carcinoma.  After a thorough review of the management options for their renal mass, they elected to proceed with surgical treatment and the above procedure.  We have discussed the potential benefits and risks of the procedure, side effects of the proposed treatment, the likelihood of the patient achieving the goals of the procedure, and any potential problems that might occur during the procedure or recuperation. Informed consent has been obtained.  Description of procedure:  The patient was taken to the operating room and a general anesthetic was administered. The patient was given preoperative antibiotics, placed in the left modified flank position, and prepped and draped in the usual sterile fashion. Next a preoperative timeout was performed.  A site was selected near the umbilicus for placement of the camera port. This was placed using a standard open Hassan technique which allowed entry into the peritoneal cavity under direct vision and without difficulty. A 12 mm Hassan cannula was placed and a pneumoperitoneum established. The camera was then used to inspect the abdomen and there was no evidence of any intra-abdominal injuries or other abnormalities. The remaining abdominal ports were then placed. A 12 mm port was placed in the left lower quadrant and a 5 mm port was placed in the left upper quadrant.  All ports were placed under direct vision without difficulty.  Utilizing the harmonic scalpel, the white  line of Toldt was incised allowing the colon to be reflected medially and the plane between the mesocolon and the anterior layer of Gerota's fascia to be developed and the kidney exposed.  The ureter and gonadal vein were identified inferiorly and the ureter was lifted anteriorly off the psoas muscle.  Dissection proceeded superiorly along the gonadal vein until the renal vein was identified.  The renal hilum was then carefully isolated with a combination of blunt and sharp dissectiong allowing the renal arterial and venous structures to be separated and isolated.   The renal artery was isolated and ligated with multiple Weck clips and subsequently divided.  The renal vein was then isolated and also ligated and divided with a 45 mm Flex ETS stapler.  Gerota's fascia was intentionally entered superiorly and the space between the adrenal gland and the kidney was developed allowing the adrenal gland to be spared.  The splenorenal ligaments were divided with the harmonic scalpel.  The lateral and posterior attachements to the kidney were then divided.  The ureter was ligated with Weck clips and divided allowing the specimen to be freed from all surrounding structures.  The kidney specimen was then placed into a 12 mm retrieval bag.  The renal hilum, liver, adrenal bed and gonadal vein areas were each inspected and hemostasis was ensured with the pneomperitoneal pressures lowered.  The 12 mm lower quadrant port was then closed with a 0-vicryl suture placed laparoscopically to close the fascia of this incision. All remaining ports were removed under direct vision.  The kidney specimen was removed intact within the retrieval bag via the camera port site after this incision was extended slightly. This fascial  opening was then closed with two #1 PDS sutures.    All incisions were injected with local anesthetic and reapproximated at the skin with 4-0 monocryl sutures.  Dermabond was applied to the skin. The patient  tolerated the procedure well and without complications and was transferred to the recovery unit in satisfactory condition.   Pryor Curia MD

## 2021-12-18 NOTE — Discharge Instructions (Signed)

## 2021-12-19 ENCOUNTER — Encounter (HOSPITAL_COMMUNITY): Payer: Self-pay | Admitting: Urology

## 2021-12-19 LAB — BASIC METABOLIC PANEL
Anion gap: 7 (ref 5–15)
BUN: 15 mg/dL (ref 8–23)
CO2: 23 mmol/L (ref 22–32)
Calcium: 8.8 mg/dL — ABNORMAL LOW (ref 8.9–10.3)
Chloride: 104 mmol/L (ref 98–111)
Creatinine, Ser: 1.89 mg/dL — ABNORMAL HIGH (ref 0.44–1.00)
GFR, Estimated: 27 mL/min — ABNORMAL LOW (ref >60)
Glucose, Bld: 134 mg/dL — ABNORMAL HIGH (ref 70–99)
Potassium: 4.4 mmol/L (ref 3.5–5.1)
Sodium: 134 mmol/L — ABNORMAL LOW (ref 135–145)

## 2021-12-19 LAB — HEMOGLOBIN AND HEMATOCRIT, BLOOD
HCT: 35.4 % — ABNORMAL LOW (ref 36.0–46.0)
Hemoglobin: 11.3 g/dL — ABNORMAL LOW (ref 12.0–15.0)

## 2021-12-19 LAB — SURGICAL PATHOLOGY

## 2021-12-19 MED ORDER — MORPHINE SULFATE (PF) 2 MG/ML IV SOLN
0.5000 mg | Freq: Once | INTRAVENOUS | Status: AC
  Administered 2021-12-19: 0.5 mg via INTRAVENOUS
  Filled 2021-12-19: qty 1

## 2021-12-19 MED ORDER — TRAMADOL HCL 50 MG PO TABS
50.0000 mg | ORAL_TABLET | Freq: Four times a day (QID) | ORAL | Status: DC | PRN
  Administered 2021-12-19 – 2021-12-20 (×5): 100 mg via ORAL
  Filled 2021-12-19 (×5): qty 2

## 2021-12-19 MED ORDER — BISACODYL 10 MG RE SUPP
10.0000 mg | Freq: Once | RECTAL | Status: AC
  Administered 2021-12-19: 10 mg via RECTAL
  Filled 2021-12-19: qty 1

## 2021-12-19 NOTE — Progress Notes (Signed)
[name], female   DOB: [birth year], 76 y.o.   MRN: [id number]  1 Day Post-Op Subjective: No nausea or vomiting.  Passing flatus.  Ambulated yesterday without trouble.  Objective: Vital signs in last 24 hours: Temp:  [97.6 F (36.4 C)-98.2 F (36.8 C)] 98.2 F (36.8 C) (09/19 0627) Pulse Rate:  [63-72] 68 (09/19 0627) Resp:  [14-21] 16 (09/19 0627) BP: (118-154)/(64-85) 125/69 (09/19 0627) SpO2:  [96 %-100 %] 98 % (09/19 0627) Arterial Line BP: (161-164)/(65-69) 164/65 (09/18 1000)  Intake/Output from previous day: 09/18 0701 - 09/19 0700 In: 5109.6 [P.O.:1440; I.V.:3169.6; IV Piggyback:500] Out: 2625 [Urine:2575; Blood:50] Intake/Output this shift: No intake/output data recorded.  Physical Exam:  General: Alert and oriented CV: RRR Lungs: Clear Abdomen: Soft, ND Incisions: C/D/I Ext: NT, No erythema  Lab Results: Recent Labs    12/18/21 1624 12/19/21 0506  HGB 11.6* 11.3*  HCT 36.4 35.4*   BMET Recent Labs    12/18/21 1624 12/19/21 0506  NA 133* 134*  K 4.8 4.4  CL 103 104  CO2 24 23  GLUCOSE 353* 134*  BUN 14 15  CREATININE 1.68* 1.89*  CALCIUM 8.5* 8.8*     Studies/Results: No results found.  Assessment/Plan: POD # 1 s/p left laparoscopic radical nephrectomy - Ambulate, IS - Decrease IVF - D/C Foley - Advance diet - Oral pain medication - Re-evaluate for possible discharge later   LOS: 1 day   Dutch Gray 12/19/2021, 7:17 AM

## 2021-12-19 NOTE — Progress Notes (Signed)
Mobility Specialist - Progress Note   12/19/21 1327  Mobility  Activity Ambulated with assistance in hallway  Range of Motion/Exercises Active  Level of Assistance Modified independent, requires aide device or extra time  Assistive Device Front wheel walker  Distance Ambulated (ft) 100 ft  Activity Response Tolerated well  $Mobility charge 1 Mobility   Pt was found in recliner chair and agreeable to ambulate. Pt stated having pain in her upper abdomin before ambulating. While ambulating she stated her pain level was a 7/10 and at EOS she returned to bed with all necessities in reach.  Ferd Hibbs Mobility Specialist

## 2021-12-19 NOTE — Progress Notes (Signed)
  Transition of Care [facility]) Screening Note   Patient Details  Name: Gabriela Wallace Date of Birth: [birth year]   Transition of Care [facility] [address]) CM/SW Contact:    Dessa Phi, RN Phone Number: 12/19/2021, 3:40 PM    Transition of Care Department [facility]) has reviewed patient and no TOC needs have been identified at this time. We will continue to monitor patient advancement through interdisciplinary progression rounds. If new patient transition needs arise, please place a TOC consult.

## 2021-12-20 ENCOUNTER — Other Ambulatory Visit (HOSPITAL_COMMUNITY): Payer: Self-pay

## 2021-12-20 LAB — BASIC METABOLIC PANEL
Anion gap: 10 (ref 5–15)
BUN: 17 mg/dL (ref 8–23)
CO2: 25 mmol/L (ref 22–32)
Calcium: 9 mg/dL (ref 8.9–10.3)
Chloride: 101 mmol/L (ref 98–111)
Creatinine, Ser: 2 mg/dL — ABNORMAL HIGH (ref 0.44–1.00)
GFR, Estimated: 25 mL/min — ABNORMAL LOW (ref >60)
Glucose, Bld: 102 mg/dL — ABNORMAL HIGH (ref 70–99)
Potassium: 4.5 mmol/L (ref 3.5–5.1)
Sodium: 136 mmol/L (ref 135–145)

## 2021-12-20 LAB — HEMOGLOBIN AND HEMATOCRIT, BLOOD
HCT: 39.4 % (ref 36.0–46.0)
Hemoglobin: 12.6 g/dL (ref 12.0–15.0)

## 2021-12-20 MED ORDER — BISACODYL 10 MG RE SUPP
10.0000 mg | Freq: Once | RECTAL | Status: AC
  Administered 2021-12-20: 10 mg via RECTAL
  Filled 2021-12-20: qty 1

## 2021-12-20 NOTE — Progress Notes (Signed)
Mobility Specialist - Progress Note   12/20/21 1204  Mobility  HOB Elevated/Bed Position Self regulated  Activity Ambulated with assistance in hallway  Range of Motion/Exercises Active  Level of Assistance Modified independent, requires aide device or extra time  Assistive Device Front wheel walker  Distance Ambulated (ft) 250 ft  Activity Response Tolerated well  $Mobility charge 1 Mobility   Pt was found in bed and agreeable to mobilize. Stated her abdominal pain level was a 2/10 while ambulating. At EOS returned to recliner chair with all necessities in reach and daughter in room.  Ferd Hibbs Mobility Specialist

## 2021-12-20 NOTE — Progress Notes (Signed)
[name], female   DOB: 08-05-1945, 76 y.o.   MRN: [id number]  2 Days Post-Op Subjective: Pt doing ok.  Still with abdominal pain.  Required IV pain medication last night.  Some flatus. Urge incontinence.  Objective: Vital signs in last 24 hours: Temp:  [98.1 F (36.7 C)-99.4 F (37.4 C)] 99.4 F (37.4 C) (09/20 0420) Pulse Rate:  [66-94] 91 (09/20 0420) Resp:  [18-20] 20 (09/20 0420) BP: (139-144)/(64-76) 139/76 (09/20 0420) SpO2:  [91 %-98 %] 92 % (09/20 0420)  Intake/Output from previous day: 09/19 0701 - 09/20 0700 In: 720 [P.O.:720] Out: 1450 [Urine:1450] Intake/Output this shift: Total I/O In: 240 [P.O.:240] Out: 850 [Urine:850]  Physical Exam:  General: Alert and oriented CV: RRR Lungs: Clear Abdomen: Soft, ND. Mild tenderness throughout, minimal BS. Incisions: C/D/I Ext: NT, No erythema  Lab Results: Recent Labs    12/18/21 1624 12/19/21 0506 12/20/21 0524  HGB 11.6* 11.3* 12.6  HCT 36.4 35.4* 39.4   BMET Recent Labs    12/19/21 0506 12/20/21 0524  NA 134* 136  K 4.4 4.5  CL 104 101  CO2 23 25  GLUCOSE 134* 102*  BUN 15 17  CREATININE 1.89* 2.00*  CALCIUM 8.8* 9.0     Studies/Results: No results found.  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

## 2021-12-20 NOTE — Discharge Summary (Signed)
Date of admission: 12/18/2021  Date of discharge: 12/20/2021  Admission diagnosis: Left renal cell carcinoma  Discharge diagnosis: Left renal cell carcinoma  Secondary diagnoses: Depression, hypertension, GERD.  History and Physical: For full details, please see admission history and physical. Briefly, Gabriela Wallace is a 76 y.o. year old patient with a biopsy proven high grade renal cell carcinoma of the left kidney.   Hospital Course: She underwent left laparoscopic radical nephrectomy on 12/18/21.  Her diet was gradually advanced and she was transitioned to oral pain medication.  She was able to ambulate and had return of bowel function.  She was stable for discharge home on POD #2.  Her renal function stabilized with a Cr of 2.0.  Laboratory values:  Recent Labs    12/18/21 1624 12/19/21 0506 12/20/21 0524  HGB 11.6* 11.3* 12.6  HCT 36.4 35.4* 39.4   Recent Labs    12/19/21 0506 12/20/21 0524  CREATININE 1.89* 2.00*    Disposition: Home  Discharge instruction: The patient was instructed to be ambulatory but told to refrain from heavy lifting, strenuous activity, or driving.   Discharge medications:  Allergies as of 12/20/2021   No Known Allergies      Medication List     STOP taking these medications    multivitamin with minerals Tabs tablet   Vitamin D (Ergocalciferol) 1.25 MG (50000 UNIT) Caps capsule Commonly known as: DRISDOL       TAKE these medications    acetaminophen 500 MG tablet Commonly known as: TYLENOL Take 1,000 mg by mouth every 6 (six) hours as needed for moderate pain.   baclofen 10 MG tablet Commonly known as: LIORESAL Take 10 mg by mouth 3 (three) times daily as needed for muscle spasms.   citalopram 10 MG tablet Commonly known as: CELEXA Take 10 mg by mouth daily.   docusate sodium 100 MG capsule Commonly known as: COLACE Take 1 capsule (100 mg total) by mouth 2 (two) times daily.   hydrochlorothiazide 25 MG tablet Commonly  known as: HYDRODIURIL Take 25 mg by mouth every morning.   pantoprazole 20 MG tablet Commonly known as: PROTONIX Take 20 mg by mouth daily.   potassium chloride SA 20 MEQ tablet Commonly known as: KLOR-CON M Take 20 mEq by mouth daily.   promethazine 25 MG tablet Commonly known as: PHENERGAN Take 25 mg by mouth every 6 (six) hours as needed for nausea/vomiting.   traMADol 50 MG tablet Commonly known as: Ultram Take 1-2 tablets (50-100 mg total) by mouth every 6 (six) hours as needed for moderate pain or severe pain.        Followup:   Follow-up Information     Raynelle Bring, MD Follow up on 01/10/2022.   Specialty: Urology Why: at 12:45 Contact information: [place] [id number] [phone]

## 2022-09-27 IMAGING — CT CT BIOPSY CORE RENAL
1 of 4 series · 9 of 32 positions shown, 15 images · non-contrast
Comparison: none

INDICATION: 75-year-old woman seen solid left renal mass presents to for
CT-guided biopsy.

EXAM:
CT-guided biopsy left renal mass
TECHNIQUE: Multidetector CT imaging of the abdomen was performed following the
standard protocol without IV contrast.

[Series 2: i-spiral 5.0 b40f · axial · 0.95mm/px · z∈[+938,+1064]mm · 9 of 46 slices shown, 15 images]
[im 5/46  soft-tissue]
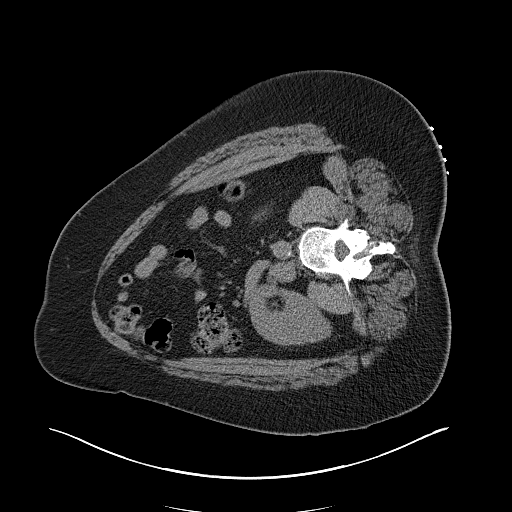
[im 5/46  bone]
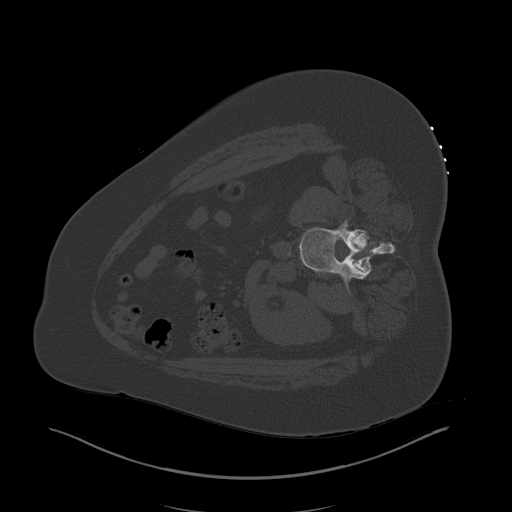
[im 10/46  soft-tissue]
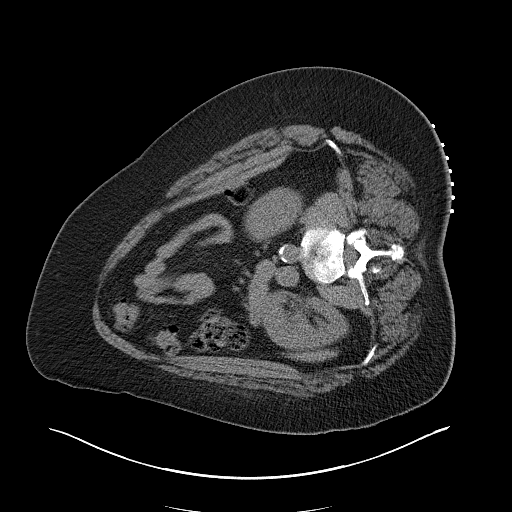
[im 14/46  soft-tissue]
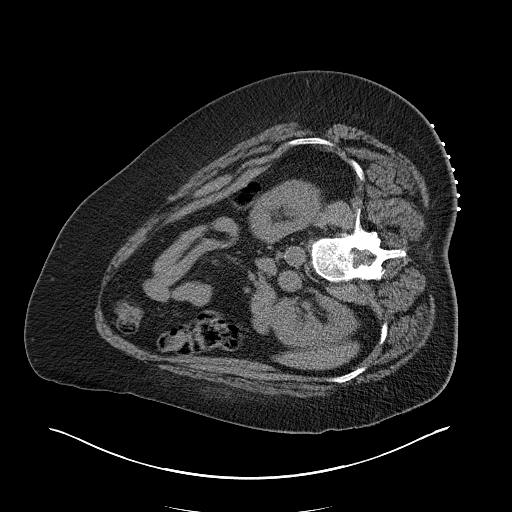
[im 19/46  soft-tissue]
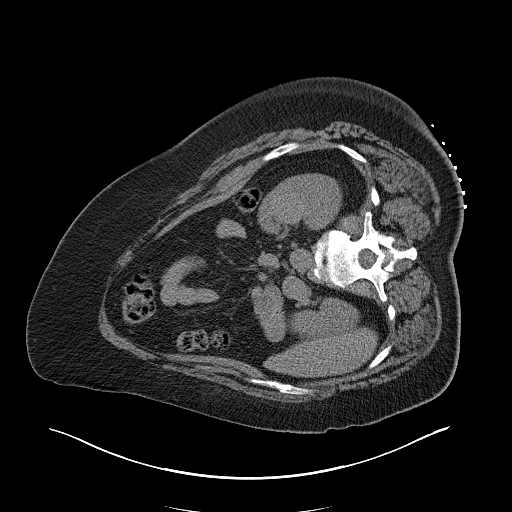
[im 23/46  soft-tissue]
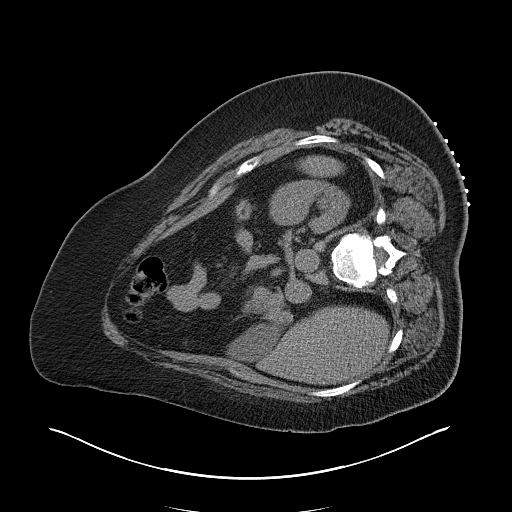
[im 28/46  soft-tissue]
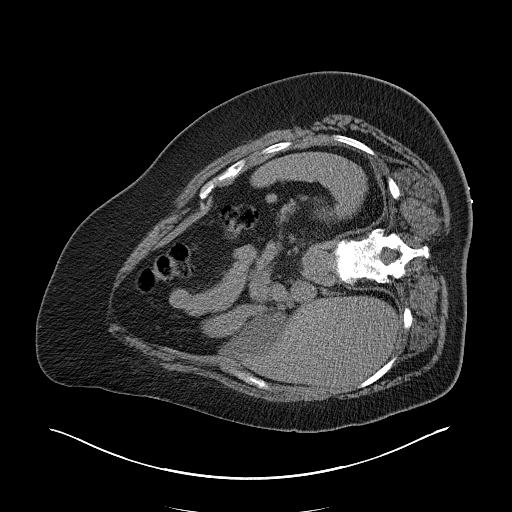
[im 28/46  lung]
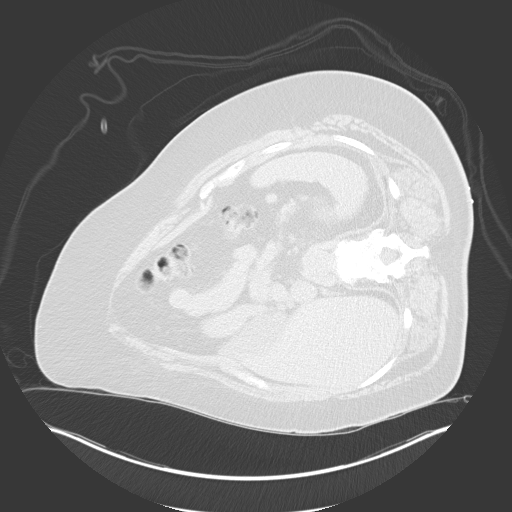
[im 32/46  soft-tissue]
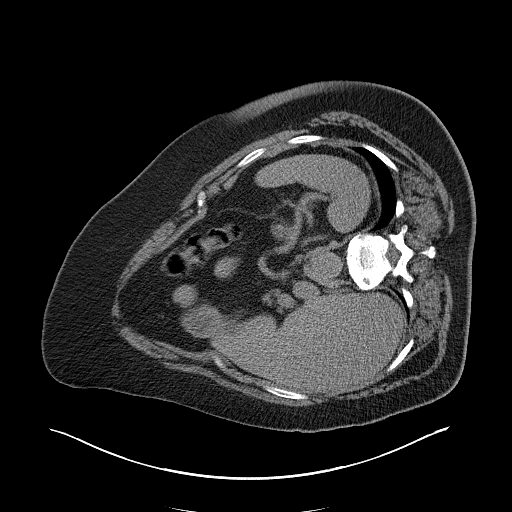
[im 32/46  lung]
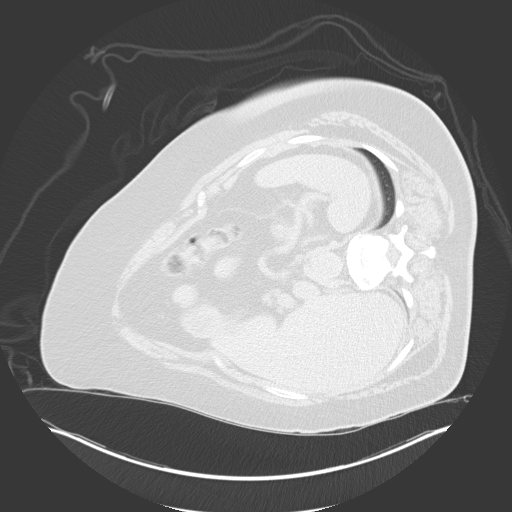
[im 37/46  soft-tissue]
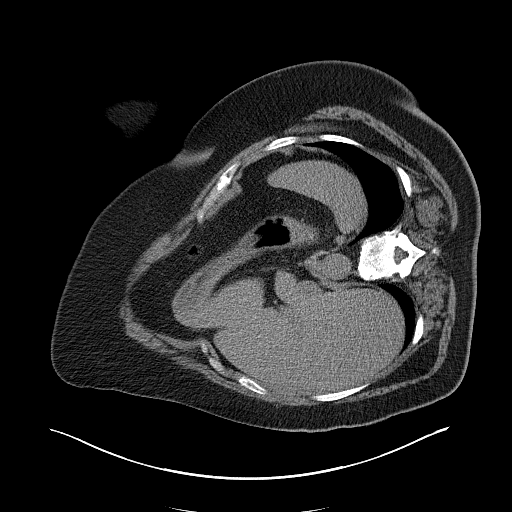
[im 37/46  lung]
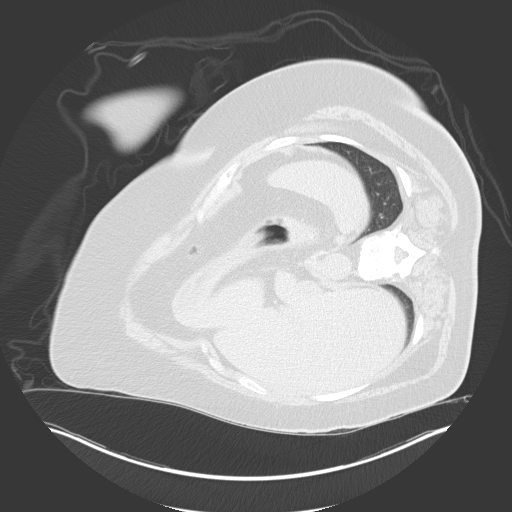
[im 41/46  soft-tissue]
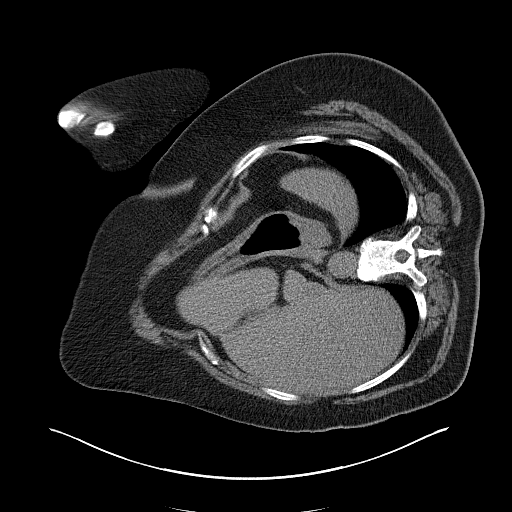
[im 41/46  lung]
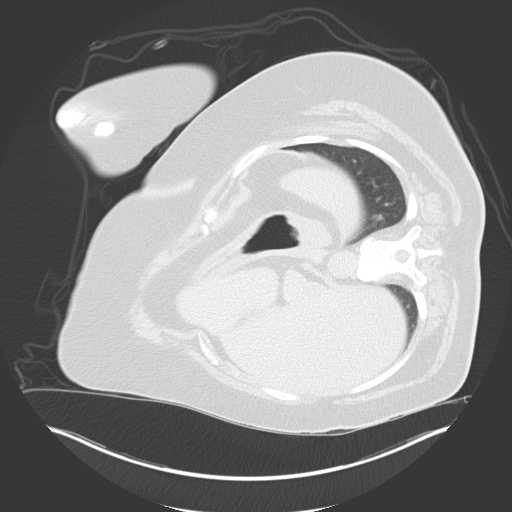
[im 41/46  bone]
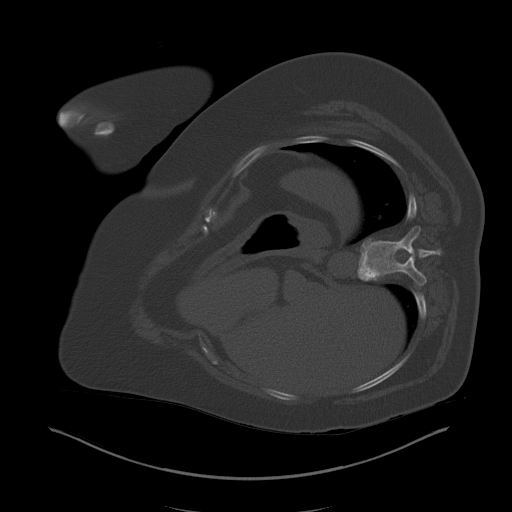

[9 of 32 positions shown; findings below may reference images not displayed]

RADIATION DOSE REDUCTION: This exam was performed according to the
departmental dose-optimization program which includes automated
exposure control, adjustment of the mA and/or kV according to
patient size and/or use of iterative reconstruction technique.

MEDICATIONS:
Hydralazine 10 mg IV

ANESTHESIA/SEDATION:
Moderate (conscious) sedation was employed during this procedure. A
total of Versed 1.5 mg and Fentanyl 50 mcg was administered
intravenously by the radiology nurse.

Total intra-service moderate Sedation Time: 55 minutes. The
patient's level of consciousness and vital signs were monitored
continuously by radiology nursing throughout the procedure under my
direct supervision.

COMPLICATIONS:
None immediate.

PROCEDURE:
Informed written consent was obtained from the patient after a
thorough discussion of the procedural risks, benefits and
alternatives. All questions were addressed. Maximal Sterile Barrier
Technique was utilized including caps, mask, sterile gowns, sterile
gloves, sterile drape, hand hygiene and skin antiseptic. A timeout
was performed prior to the initiation of the procedure.

Patient positioned right lateral decubitus the CT table. The left
flank skin prepped and draped in usual fashion. Following local
administration, a 17 gauge introducer needle was into the
anterolateral aspect of the left renal mass. 4-18 gauge cores were
obtained from the left renal mass.

Gel-Foam slurry was administered through the introducer needle.
Manual compression applied over the site for hemostasis. Post
procedure CT demonstrated no significant hematoma.
IMPRESSION: CT-guided biopsy of left renal mass.

## 2023-09-25 ENCOUNTER — Ambulatory Visit: Admitting: Neurology

## 2023-09-25 ENCOUNTER — Encounter: Payer: Self-pay | Admitting: Neurology

## 2023-09-25 VITALS — BP 142/85 | HR 91 | Resp 16 | Ht 66.0 in | Wt 256.5 lb

## 2023-09-25 DIAGNOSIS — Z87898 Personal history of other specified conditions: Secondary | ICD-10-CM | POA: Diagnosis not present

## 2023-09-25 DIAGNOSIS — R55 Syncope and collapse: Secondary | ICD-10-CM | POA: Diagnosis not present

## 2023-09-25 NOTE — Patient Instructions (Signed)
 Routine EEG, I will contact you to go over the results Continue current medication Continue follow-up PCP Return as needed

## 2023-09-25 NOTE — Progress Notes (Signed)
 [facility]  PATIENT: Gabriela Wallace DOB: 1945/06/20  REQUESTING CLINICIAN: Trudy Wallace FALCON, MD HISTORY FROM: Patient and sister  REASON FOR VISIT: Syncope   HISTORICAL  CHIEF COMPLAINT:  Chief Complaint  Patient presents with   New Patient (Initial Visit)    Rm12, sister, referral for possible seizure/Dr. Vaughn Gabriela [facility] Fam Fzi:ojdu sz 20 plus years ago, well controlled. Recent fall 2 weeks ago Monday an doctor has concerns may be sz related     HISTORY OF PRESENT ILLNESS:  This is a 78 year old woman past medical history of hypertension, GERD, obesity who is presenting after an unexplained fall.  Patient reports 2 weeks ago, around 1 AM, she got up to use the bathroom.  She tells me she was able to use the bathroom and the next thing that she remembers is waking up from the floor.  Denies any injury, no tongue biting, no urinary incontinence.  She did follow-up with his PCP but because of her history of seizures, she was referred here.  She tells me her last seizure was more than 20 years ago.  Her previous seizures were described as epigastric rising, feeling of fear then she will pass out and have a convulsion.  She was previously managed on phenytoin and carbamazepine but self discontinued the medication 15 years ago,.  She has not had any additional seizures.   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  Her syncopal episode was 2 weeks ago  Seizure Type: Abdominal rising, feeling of fear then loss of consciousness and convulsion  Current frequency: Last seizure was more than 20 years ago.  Syncope was 2 weeks ago  Any injuries from seizures: Denies  Seizure risk factors: None   Previous ASMs: Phenytoin, Carbamazepine   Currenty ASMs: None  ASMs side effects: Not applicable  Brain Images: Not available for review  Previous EEGs: Not available for review   OTHER MEDICAL CONDITIONS:  Hypertension, GERD   REVIEW OF SYSTEMS: Full 14 system review of systems performed and negative with exception of: As noted in the HPI   ALLERGIES: Allergies  Allergen Reactions   Pineapple Other (See Comments)    Tongue gets blistered    HOME MEDICATIONS: Outpatient Medications Prior to Visit  Medication Sig Dispense Refill   acetaminophen  (TYLENOL ) 500 MG tablet Take 1,000 mg by mouth every 6 (six) hours as needed for moderate pain.     amLODipine (NORVASC) 5 MG tablet Take 1 tablet by mouth daily.     baclofen  (LIORESAL ) 10 MG tablet Take 10 mg by mouth 3 (three) times daily as needed for muscle spasms.     hydrochlorothiazide  (HYDRODIURIL ) 25 MG tablet Take 25 mg by mouth every morning.     pantoprazole  (PROTONIX ) 20 MG tablet Take 20 mg by mouth daily.     citalopram  (CELEXA ) 10 MG tablet Take 10 mg by mouth daily.     docusate sodium  (COLACE) 100 MG capsule Take 1 capsule (100 mg total) by mouth 2 (two) times daily.     potassium chloride SA (KLOR-CON M) 20 MEQ tablet Take 20 mEq by mouth daily.     promethazine (PHENERGAN) 25 MG tablet Take 25 mg by mouth every 6 (six) hours as needed for nausea/vomiting.     traMADol  (ULTRAM ) 50 MG tablet Take 1-2 tablets (50-100 mg total) by mouth every 6 (six) hours as needed for moderate pain or severe pain. 20 tablet 0  No facility-administered medications prior to visit.    PAST MEDICAL HISTORY: Past Medical History:  Diagnosis Date   Anxiety    Arthritis    Depression    GERD (gastroesophageal reflux disease)    Hypertension    Macular degeneration    Neuropathy    Pre-diabetes    Renal cell cancer (HCC)    Left   Seizures (HCC)    last seizure 10 yrs ago. unknown etiology and on no meds now.    PAST SURGICAL HISTORY: Past Surgical History:  Procedure Laterality Date   ABDOMINAL HYSTERECTOMY     CATARACT EXTRACTION     right eye-Dr Hunt-[facility]   CATARACT EXTRACTION W/PHACO Right 06/09/2012   Procedure: CATARACT  EXTRACTION PHACO AND INTRAOCULAR LENS PLACEMENT (IOC);  Surgeon: Cherene Mania, MD;  Location: [facility];  Service: Ophthalmology;  Laterality: Right;  CDE=22.34   CYSTOSCOPY WITH URETEROSCOPY AND STENT PLACEMENT Left 08/31/2021   Procedure: CYSTOSCOPY WITH RETROGRADE;  Surgeon: Renda Glance, MD;  Location: [facility];  Service: Urology;  Laterality: Left;   KNEE ARTHROSCOPY WITH MEDIAL MENISECTOMY Right 07/27/2015   Procedure: KNEE ARTHROSCOPY WITH MEDIAL MENISECTOMY WITH REMOVAL OF LOOSE BODY;  Surgeon: Taft FORBES Minerva, MD;  Location: [facility];  Service: Orthopedics;  Laterality: Right;   LAPAROSCOPIC NEPHRECTOMY Left 12/18/2021   Procedure: LAPAROSCOPIC RADICAL NEPHRECTOMY;  Surgeon: Renda Glance, MD;  Location: [facility];  Service: Urology;  Laterality: Left;  ONLY NEEDS 180 MIN    FAMILY HISTORY: Family History  Problem Relation Age of Onset   Cancer Sister    Heart disease Maternal Grandmother    Neuropathy Neg Hx     SOCIAL HISTORY: Social History   Socioeconomic History   Marital status: Widowed    Spouse name: Not on file   Number of children: Not on file   Years of education: 9   Highest education level: Not on file  Occupational History   Occupation: Gabriela Wallace  Tobacco Use   Smoking status: Never   Smokeless tobacco: Never  Vaping Use   Vaping status: Never Used  Substance and Sexual Activity   Alcohol use: Yes    Alcohol/week: 1.0 standard drink of alcohol    Types: 1 Glasses of wine per week    Comment: occassional wine   Drug use: No   Sexual activity: Yes    Birth control/protection: Surgical  Other Topics Concern   Not on file  Social History Narrative   Lives alone   Caffeine use: 2 cups coffee per day   Social Drivers of Health   Financial Resource Strain: Low Risk  (09/26/2022)   Received from [facility]   Overall Financial Resource Strain (CARDIA)    Difficulty of Paying Living Expenses: Not hard at all  Food Insecurity: No Food Insecurity  (06/18/2023)   Received from [facility]   Hunger Vital Sign    Within the past 12 months, you worried that your food would run out before you got the money to buy more.: Never true    Within the past 12 months, the food you bought just didn't last and you didn't have money to get more.: Never true  Transportation Needs: No Transportation Needs (09/26/2022)   Received from [facility]   PRAPARE - Transportation    Lack of Transportation (Medical): No    Lack of Transportation (Non-Medical): No  Physical Activity: Sufficiently Active (07/24/2022)   Received from [facility]   Exercise Vital Sign  On average, how many days per week do you engage in moderate to strenuous exercise (like a brisk walk)?: 4 days    On average, how many minutes do you engage in exercise at this level?: 40 min  Stress: No Stress Concern Present (06/18/2023)   Received from [facility] - Occupational Stress Questionnaire    Feeling of Stress : Not at all  Social Connections: Socially Integrated (07/24/2022)   Received from [facility]   Social Connection and Isolation Panel    In a typical week, how many times do you talk on the phone with family, friends, or neighbors?: Three times a week    How often do you get together with friends or relatives?: Twice a week    How often do you attend church or religious services?: 1 to 4 times per year    Active Member of Clubs or Organizations: Not on file    How often do you attend meetings of the clubs or organizations you belong to?: 1 to 4 times per year    Are you married, widowed, divorced, separated, never married, or living with a partner?: Married  Intimate Partner Violence: Not At Risk (04/01/2023)   Received from [facility]   Humiliation, Afraid, Rape, and Kick questionnaire    Within the last year, have you been afraid of your partner or ex-partner?: No    Within the last year, have you been  humiliated or emotionally abused in other ways by your partner or ex-partner?: No    Within the last year, have you been kicked, hit, slapped, or otherwise physically hurt by your partner or ex-partner?: No    Within the last year, have you been raped or forced to have any kind of sexual activity by your partner or ex-partner?: No    PHYSICAL EXAM  GENERAL EXAM/CONSTITUTIONAL: Vitals:  Vitals:   09/25/23 1259 09/25/23 1305  BP: (!) 154/87 (!) 142/85  Pulse: 91   Resp: 16   SpO2: 92%   Weight: 256 lb 8 oz (116.3 kg)   Height: 5' 6 (1.676 m)    Body mass index is 41.4 kg/m. Wt Readings from Last 3 Encounters:  09/25/23 256 lb 8 oz (116.3 kg)  12/18/21 248 lb 3.2 oz (112.6 kg)  12/12/21 248 lb 3.2 oz (112.6 kg)   Patient is in no distress; well developed, nourished and groomed; neck is supple  MUSCULOSKELETAL: Gait, strength, tone, movements noted in Neurologic exam below  NEUROLOGIC: MENTAL STATUS:      No data to display         awake, alert, oriented to person, place and time recent and remote memory intact normal attention and concentration language fluent, comprehension intact, naming intact fund of knowledge appropriate  CRANIAL NERVE:  2nd, 3rd, 4th, 6th - Visual fields full to confrontation, extraocular muscles intact, no nystagmus 5th - facial sensation symmetric 7th - facial strength symmetric 8th - hearing intact 9th - palate elevates symmetrically, uvula midline 11th - shoulder shrug symmetric 12th - tongue protrusion midline  MOTOR:  normal bulk and tone, full strength in the BUE, BLE  SENSORY:  normal and symmetric to light touch  COORDINATION:  finger-nose-finger, fine finger movements normal  GAIT/STATION:  Antalgic gait    DIAGNOSTIC DATA (LABS, IMAGING, TESTING) - I reviewed patient records, labs, notes, testing and imaging myself where available.  Lab Results  Component Value Date   WBC 5.7 12/12/2021  HGB 12.6 12/20/2021    HCT 39.4 12/20/2021   MCV 84.6 12/12/2021   PLT 278 12/12/2021      Component Value Date/Time   NA 136 12/20/2021 0524   NA 139 06/29/2015 1428   K 4.5 12/20/2021 0524   CL 101 12/20/2021 0524   CO2 25 12/20/2021 0524   GLUCOSE 102 (H) 12/20/2021 0524   BUN 17 12/20/2021 0524   BUN 14 06/29/2015 1428   CREATININE 2.00 (H) 12/20/2021 0524   CALCIUM 9.0 12/20/2021 0524   PROT 7.8 06/29/2015 1428   ALBUMIN 4.3 06/29/2015 1428   AST 19 06/29/2015 1428   ALT 9 06/29/2015 1428   ALKPHOS 86 06/29/2015 1428   BILITOT 0.4 06/29/2015 1428   GFRNONAA 25 (L) 12/20/2021 0524   GFRAA 73 06/29/2015 1428   No results found for: CHOL, HDL, LDLCALC, LDLDIRECT, TRIG Lab Results  Component Value Date   HGBA1C 6.4 (H) 12/12/2021   Lab Results  Component Value Date   VITAMINB12 518 06/29/2015   Lab Results  Component Value Date   TSH 1.380 06/29/2015    ASSESSMENT AND PLAN  78 y.o. year old female  with history of seizures, last episode more than 20 years ago, hypertension, obesity, GERD who is presenting after an unexplained fall.  Based on description, waking up at 1 AM to use the bathroom, I do suspect patient did have a vasovagal syncope.  But due to her history of seizures, will obtain routine EEG, if any abnormality will obtain brain MRI and discuss restarting antiseizure medication.  If EEG is normal, we will continue to observe patient and she will contact us  if she is having more syncopal episodes.  She is agreeable with plan.   1. Syncope, unspecified syncope type   2. History of seizure     Patient Instructions  Routine EEG, I will contact you to go over the results Continue current medication Continue follow-up PCP Return as needed   Per [place] statutes, patients with seizures are not allowed to drive until they have been seizure-free for six months.  Other recommendations include using caution when using heavy equipment or power tools. Avoid working  on ladders or at heights. Take showers instead of baths.  Do not swim alone.  Ensure the water temperature is not too high on the home water heater. Do not go swimming alone. Do not lock yourself in a room alone (i.e. bathroom). When caring for infants or small children, sit down when holding, feeding, or changing them to minimize risk of injury to the child in the event you have a seizure. Maintain good sleep hygiene. Avoid alcohol.  Also recommend adequate sleep, hydration, good diet and minimize stress.   During the Seizure  - First, ensure adequate ventilation and place patients on the floor on their left side  Loosen clothing around the neck and ensure the airway is patent. If the patient is clenching the teeth, do not force the mouth open with any object as this can cause severe damage - Remove all items from the surrounding that can be hazardous. The patient may be oblivious to what's happening and may not even know what he or she is doing. If the patient is confused and wandering, either gently guide him/her away and block access to outside areas - Reassure the individual and be comforting - Call 911. In most cases, the seizure ends before EMS arrives. However, there are cases when seizures may last over 3 to 5  minutes. Or the individual may have developed breathing difficulties or severe injuries. If a pregnant patient or a person with diabetes develops a seizure, it is prudent to call an ambulance. - Finally, if the patient does not regain full consciousness, then call EMS. Most patients will remain confused for about 45 to 90 minutes after a seizure, so you must use judgment in calling for help. - Avoid restraints but make sure the patient is in a bed with padded side rails - Place the individual in a lateral position with the neck slightly flexed; this will help the saliva drain from the mouth and prevent the tongue from falling backward - Remove all nearby furniture and other hazards from  the area - Provide verbal assurance as the individual is regaining consciousness - Provide the patient with privacy if possible - Call for help and start treatment as ordered by the caregiver   After the Seizure (Postictal Stage)  After a seizure, most patients experience confusion, fatigue, muscle pain and/or a headache. Thus, one should permit the individual to sleep. For the next few days, reassurance is essential. Being calm and helping reorient the person is also of importance.  Most seizures are painless and end spontaneously. Seizures are not harmful to others but can lead to complications such as stress on the lungs, brain and the heart. Individuals with prior lung problems may develop labored breathing and respiratory distress.    Discussed Patients with epilepsy have a small risk of sudden unexpected death, a condition referred to as sudden unexpected death in epilepsy (SUDEP). SUDEP is defined specifically as the sudden, unexpected, witnessed or unwitnessed, nontraumatic and nondrowning death in patients with epilepsy with or without evidence for a seizure, and excluding documented status epilepticus, in which post mortem examination does not reveal a structural or toxicologic cause for death     Orders Placed This Encounter  Procedures   EEG adult    No orders of the defined types were placed in this encounter.   Return if symptoms worsen or fail to improve.    Pastor Falling, MD 09/25/2023, 1:34 PM  [facility] [address] [place], [place] [address] [phone]

## 2023-10-07 ENCOUNTER — Other Ambulatory Visit (HOSPITAL_COMMUNITY): Payer: Self-pay | Admitting: Nephrology

## 2023-10-07 DIAGNOSIS — Z85528 Personal history of other malignant neoplasm of kidney: Secondary | ICD-10-CM

## 2023-10-07 DIAGNOSIS — N1832 Chronic kidney disease, stage 3b: Secondary | ICD-10-CM

## 2023-10-08 ENCOUNTER — Ambulatory Visit: Admitting: Neurology

## 2023-10-08 DIAGNOSIS — Z87898 Personal history of other specified conditions: Secondary | ICD-10-CM

## 2023-10-08 DIAGNOSIS — R55 Syncope and collapse: Secondary | ICD-10-CM | POA: Diagnosis not present

## 2023-10-09 ENCOUNTER — Ambulatory Visit: Payer: Self-pay | Admitting: Neurology

## 2023-10-09 NOTE — Procedures (Signed)
    History:  78 year old woman with seizure vs. Syncope   EEG classification: Awake and drowsy  Duration: 2 minutes   Technical aspects: This EEG study was done with scalp electrodes positioned according to the 10-20 International system of electrode placement. Electrical activity was reviewed with band pass filter of 1-70Hz , sensitivity of 7 uV/mm, display speed of 35mm/sec with a 60Hz  notched filter applied as appropriate. EEG data were recorded continuously and digitally stored.   Description of the recording: The background rhythms of this recording consists of a fairly well modulated medium amplitude alpha rhythm of 8.5 to 9 Hz that is reactive to eye opening and closure. Present in the anterior head region is a 15-20 Hz beta activity. Photic stimulation was performed, did not show any abnormalities. Hyperventilation was also  performed, did not show any abnormalities. Drowsiness was manifested by background fragmentation. No abnormal epileptiform discharges seen during this recording. There was no focal slowing. There were no electrographic seizure identified.   Abnormality: None   Impression: This is a normal awake and drowsy EEG. No evidence of interictal epileptiform discharges. Normal EEGs, however, do not rule out epilepsy.    Asiah Befort, MD [facility]

## 2023-10-09 NOTE — Progress Notes (Signed)
Please call and inform patient that her recent EEG (Brain wave test) was normal. In particular, there were no epileptiform discharges and no seizures. No further action is required on this test at this time. Please keep any upcoming appointments or tests and  call us with any interim questions, concerns, problems or updates. Thanks,   Littleton Haub, MD 

## 2023-10-15 ENCOUNTER — Ambulatory Visit (HOSPITAL_COMMUNITY)
Admission: RE | Admit: 2023-10-15 | Discharge: 2023-10-15 | Disposition: A | Discharge status: Home / Self Care | Source: Ambulatory Visit | Attending: Nephrology | Admitting: Nephrology

## 2023-10-15 DIAGNOSIS — Z85528 Personal history of other malignant neoplasm of kidney: Secondary | ICD-10-CM | POA: Insufficient documentation

## 2023-10-15 DIAGNOSIS — N1832 Chronic kidney disease, stage 3b: Secondary | ICD-10-CM | POA: Diagnosis present

## 2023-10-31 NOTE — ED Provider Notes (Signed)
 Emergency Department Provider Note    ED Clinical Impression   Final diagnoses:  Rib pain on right side (Primary)  Fall, initial encounter    ED Assessment/Plan    Condition: Stable Disposition: Discharge  This chart has been completed using Dragon Medical Dictation software, and while attempts have been made to ensure accuracy, certain words and phrases may not be transcribed as intended.   History   Chief Complaint  Patient presents with  . Leg Pain    Right   . Rib Pain    Back of ribs right side.    HPI  Gabriela Wallace is a 78 y.o. female  who presents today to the  emergency department complaining of leg pain and rib pain after fall.  She reports she had a fall 2 weeks ago hitting her head and right side.  She thinks she did have a loss of consciousness but her main complaint is right-sided posterior rib pain and back pain.  She denies numbness, tingling, weakness in the legs, nausea, vomiting, change in vision.  She reports she has been able to ambulate without difficulty since the fall.  She did not see if doctor after the fall.    Allergies: is allergic to pineapple. Medications: has a current medication list which includes the following long-term medication(s): albuterol, amlodipine, and hydrochlorothiazide . 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 PSHx:  has a past surgical history that includes Rotator cuff repair (Right, 2005); Cesarean section; Breast biopsy (Right, 03/01/2003); Knee arthroscopy (Right, 07/27/2015); pr hysteroscopy,w/endo bx (N/A, 05/24/2021); pr colsc flx w/rmvl of tumor polyp lesion snare tq (N/A, 08/20/2022); Cataract extraction, bilateral (Bilateral, 2014); pr rpr aa hernia 1st 3-10 cm reducible (N/A, 09/25/2022); Nephrectomy (Left, 12/18/2021); CYSTOSCOPY WITH RETROGRADE PYELOGRAPHY (08/31/2021); and pr hysteroscopy,w/endo bx (N/A, 03/25/2023). SocHx:  reports that she has never smoked. She has never been exposed to tobacco smoke. She has never used smokeless tobacco. She reports current alcohol use of about 1.0 standard drink of alcohol per week. She reports that she does not use drugs. Allergies, Medications, Medical, Surgical, and Social History were reviewed as documented above.   Social Drivers of Health with Concerns   Internet Connectivity: Not on file     Review Of Systems  Review of Systems  All other systems reviewed and are negative.   Physical Exam   BP 137/88   Pulse 66   Temp 36.6 C (97.9 F) (Temporal)   Resp 18   Ht 167.6 cm (5' 6)   Wt (!) 117.1 kg (258 lb 3.2 oz)   SpO2 99%   BMI 41.67 kg/m   Physical Exam Vitals and nursing note reviewed.  Constitutional:      Appearance: Normal appearance.  HENT:     Head: Normocephalic and atraumatic.     Nose: Nose normal.     Mouth/Throat:     Mouth: Mucous membranes are moist.  Eyes:     Extraocular  Movements: Extraocular movements intact.     Conjunctiva/sclera: Conjunctivae normal.     Pupils: Pupils are equal, round, and reactive to light.  Cardiovascular:     Rate and Rhythm: Normal rate and regular rhythm.     Pulses: Normal pulses.     Heart sounds: Normal heart sounds.  Pulmonary:     Effort: Pulmonary effort is normal. No respiratory distress.     Breath sounds: Normal breath sounds.  Chest:     Chest  wall: Tenderness present.  Abdominal:     Palpations: Abdomen is soft.     Tenderness: There is no abdominal tenderness.  Musculoskeletal:     Cervical back: Normal range of motion and neck supple. No tenderness.     Comments: T and L-spine tenderness to palpation, no deformity or step-off, equal strength in upper and lower extremities.  No tenderness to hips, thigh, knees, tib-fib, ankle, foot  No tenderness to upper extremities  Skin:    General: Skin is warm.  Neurological:     Mental Status: She is alert.     ED Course  Medical Decision Making 78 year old female presents after fall.  She reports a fall was 2 weeks ago and she did hit her head and possibly had loss of consciousness.  She complains of right sided rib and back pain.  Will get x-rays, CT scans and reassess  X-rays and CT scan negative for acute process.  Likely rib contusion.  No focal neurodeficits on examination.  Stable for outpatient follow-up     Procedures   No results found for this visit on 10/31/23 (from the past 4464 hours).   ED Results No results found for any visits on 10/31/23. CT Cervical Spine Wo Contrast Result Date: 10/31/2023 Exam:  CT Cervical Spine without Contrast  History:  78 year old female presents after fall.  Technique: Routine cervical spine CT without IV contrast. AEC (automated exposure control) and/or manual techniques such as size-specific kV and mAs are employed where appropriate to reduce radiation exposure for all CT exams.  Comparison:  None.  Findings:   CERVICAL AND UPPER THORACIC SPINE:  Reversal of normal cervical lordosis. Degenerative changes at C1-C2 with osteophyte formation. Multilevel degenerative disease with height loss, endplate osteophytes involving C4-C5, C5-C6, C6-C7, and C7-T1. Uncovertebral hypertrophy and facet arthrosis resulting in mild to moderate right neural foraminal narrowing at C4-C5, moderate bilateral neural foraminal narrowing at C5-C6 and C6-C7.  Moderate to severe bilateral neural foraminal narrowing at C7-T1. No fractures or destructive osseous lesions.  NECK SOFT TISSUES:  Enlarged left thyroid gland with nodules. No prevertebral edema.  SKULL BASE AND POSTERIOR FOSSA:  Negative.  LUNG APICES AND SUPERIOR MEDIASTINUM:  Negative.    1.    No acute fracture or malalignment. 2.    Multilevel degenerative changes of the cervical spine as described above. 3.    Enlarged left thyroid gland with nodules. Consider thyroid ultrasound if not previously performed.  Signed (Electronic Signature): 10/31/2023 2:06 PM Signed By: Daphine Passey, MD  CT Head Wo Contrast Result Date: 10/31/2023 Exam:  CT Head without Contrast  History:  78 year old female who presents after fall.  Technique: Routine brain CT without IV contrast. AEC (automated exposure control) and/or manual techniques such as size-specific kV and mAs are employed where appropriate to reduce radiation exposure for all CT exams.  Comparison:  CT head without contrast on 10/19/2010.  Findings:   BRAIN:  No CT evidence of acute infarction, hemorrhage, edema, mass or mass effect. Foci  of hypoattenuation along the periventricular white matter, nonspecific but likely reflecting chronic microvascular ischemic change. Ventricles and basilar cisterns are unremarkable. Atherosclerotic calcification of the cavernous internal carotid arteries.  SOFT TISSUES:  Bilateral lens extraction. CALVARIUM:  Negative. No fracture. SINUSES AND MASTOIDS:  No mucosal thickening or fluid.    1.    No CT evidence of acute intracranial abnormality. 2.    Chronic microvascular ischemic change.  Signed (Electronic Signature): 10/31/2023 2:00 PM Signed By: Nevil Ghodasara, MD  XR Thoracic Spine 2 Views Result Date: 10/31/2023 Exam: Thoracic Spine  History:  Right-sided back pain, fell 2 weeks ago  Technique:  2 views  Comparison:  Lateral chest x-ray 12/26/2022, chest CT 01/23/2023  Findings:  Vertebral body heights maintained in  the visualized thoracic spine.  Multilevel disc space narrowing with osteophyte formation, similar to prior.  No acute fracture identified or pathologic subluxation.  Pedicles and posterior ribs as visualized are intact.  No paraspinal soft tissue swelling.    1.  No acute bony abnormality thoracic spine.    Signed (Electronic Signature): 10/31/2023 1:36 PM Signed By: John Matzko, MD  XR Lumbar Spine 2 or 3 Views Result Date: 10/31/2023 Exam:  Lumbar Spine  History:  Fall  Technique:  3 views.  Comparison:  Chest CT dated 01/23/2023  Findings:   No acute fracture or listhesis  Multilevel degenerative disease and facet arthropathy, most pronounced at L5-S1.  No osseous erosion.   Image bowel gas pattern is nonobstructive.     - No acute fracture or traumatic malalignment involving the lumbar spine.      Signed (Electronic Signature): 10/31/2023 1:36 PM Signed By: Reyes Going, MD  XR Ribs Right W PA Chest Result Date: 10/31/2023 Exam:  Right Unilateral Ribs with PA Chest  History:  Fall  Technique:  3 views of the right ribs with PA chest xray  Comparison:  Chest radiograph dated 07/27/2023  Findings:  RIBS:  The ribs appear to be intact. There is no demonstrable fracture nor is there bone destruction or erosion. Mineralization is normal.  CHEST: The heart size, mediastinal contours, and pulmonary vascularity are stable. The lungs are free of active disease. There is no pleural effusion or pneumothorax. There is no radiopaque foreign body.     - Negative right unilateral ribs and accompanying chest exam.    Signed (Electronic Signature): 10/31/2023 1:32 PM Signed By: Reyes Going, MD   Medications Administered: Medications - No data to display  Discharge Medications (Medications Prescribed during this  ED visit and Patient's Home Medications) :    Your Medication List     START taking these medications    cyclobenzaprine 5 MG tablet Commonly known as: FLEXERIL Take 1 tablet (5 mg total) by mouth  nightly as needed for muscle spasms.       ASK your doctor about these medications    albuterol 90 mcg/actuation inhaler Commonly known as: PROVENTIL HFA;VENTOLIN HFA Inhale 2 puffs every four (4) hours as needed for wheezing.   amlodipine 5 MG tablet Commonly known as: NORVASC Take 1 tablet (5 mg total) by mouth daily.   azithromycin 250 MG tablet Commonly known as: ZITHROMAX 2 tabs x 1 day the 1 tab by mouth for four days then stop   baclofen  10 MG tablet Commonly known as: LIORESAL  TAKE 1 TABLET BY MOUTH THREE TIMES DAILY   benzonatate 100 MG capsule Commonly known as: TESSALON Take 1-2 capsules (100-200 mg total) by mouth Three (3) times a day  as needed.   hydroCHLOROthiazide  25 MG tablet Commonly known as: HYDRODIURIL  Take 1 tablet by mouth once daily   ibuprofen 400 MG tablet Commonly known as: MOTRIN Take 1 tablet (400 mg total) by mouth every four (4) hours as needed.   MULTIPLE VITAMIN ORAL Take 1 tablet by mouth every morning.   pantoprazole  40 MG tablet Commonly known as: Protonix  Take 1 tablet (40 mg total) by mouth every morning.          Peri Jackee Victory Mickey., MD 11/01/23 [phone]

## 2023-12-04 ENCOUNTER — Ambulatory Visit: Attending: Internal Medicine | Admitting: Internal Medicine

## 2023-12-04 ENCOUNTER — Encounter: Payer: Self-pay | Admitting: Internal Medicine

## 2023-12-04 VITALS — BP 140/82 | HR 76 | Ht 66.0 in | Wt 253.2 lb

## 2023-12-04 DIAGNOSIS — I10 Essential (primary) hypertension: Secondary | ICD-10-CM | POA: Diagnosis not present

## 2023-12-04 DIAGNOSIS — R0609 Other forms of dyspnea: Secondary | ICD-10-CM | POA: Diagnosis not present

## 2023-12-04 DIAGNOSIS — R0602 Shortness of breath: Secondary | ICD-10-CM | POA: Diagnosis not present

## 2023-12-04 DIAGNOSIS — R55 Syncope and collapse: Secondary | ICD-10-CM | POA: Insufficient documentation

## 2023-12-04 NOTE — Progress Notes (Signed)
 Cardiology Office Note  Date: 12/04/2023   ID: Pranika, Finks 01-18-1946, [name]  PCP:  Trudy Vaughn FALCON, MD  Cardiologist:  Diannah SHAUNNA Maywood, MD Electrophysiologist:  None   History of Present Illness: Gabriela Wallace is a 78 y.o. female  Referred to cardiology clinic for evaluation of DOE.  Ongoing DOE for 5 to 6 months.  No leg swelling.  No angina.  No dizziness, presyncope, palpitations.  She had 1 episode of syncope when she was walking from living room to another room in her house with no prodromal/warning signs of dizziness/presyncope.  She woke up.  She had a history of seizures in the past and her PCP worked her up for seizure but it was negative.  Past Medical History:  Diagnosis Date   Anxiety    Arthritis    Depression    GERD (gastroesophageal reflux disease)    Hypertension    Macular degeneration    Neuropathy    Pre-diabetes    Renal cell cancer (HCC)    Left   Seizures (HCC)    last seizure 10 yrs ago. unknown etiology and on no meds now.    Past Surgical History:  Procedure Laterality Date   ABDOMINAL HYSTERECTOMY     CATARACT EXTRACTION     right eye-Dr Hunt-[facility]   CATARACT EXTRACTION W/PHACO Right 06/09/2012   Procedure: CATARACT EXTRACTION PHACO AND INTRAOCULAR LENS PLACEMENT (IOC);  Surgeon: Cherene Mania, MD;  Location: [facility];  Service: Ophthalmology;  Laterality: Right;  CDE=22.34   CYSTOSCOPY WITH URETEROSCOPY AND STENT PLACEMENT Left 08/31/2021   Procedure: CYSTOSCOPY WITH RETROGRADE;  Surgeon: Renda Glance, MD;  Location: [facility];  Service: Urology;  Laterality: Left;   KNEE ARTHROSCOPY WITH MEDIAL MENISECTOMY Right 07/27/2015   Procedure: KNEE ARTHROSCOPY WITH MEDIAL MENISECTOMY WITH REMOVAL OF LOOSE BODY;  Surgeon: Taft FORBES Minerva, MD;  Location: [facility];  Service: Orthopedics;  Laterality: Right;   LAPAROSCOPIC NEPHRECTOMY Left 12/18/2021   Procedure: LAPAROSCOPIC RADICAL NEPHRECTOMY;  Surgeon: Renda Glance, MD;  Location:  [facility];  Service: Urology;  Laterality: Left;  ONLY NEEDS 180 MIN    Current Outpatient Medications  Medication Sig Dispense Refill   acetaminophen  (TYLENOL ) 500 MG tablet Take 1,000 mg by mouth every 6 (six) hours as needed for moderate pain.     amLODipine (NORVASC) 5 MG tablet Take 1 tablet by mouth daily.     baclofen  (LIORESAL ) 10 MG tablet Take 10 mg by mouth 3 (three) times daily as needed for muscle spasms.     calcium carbonate (TUMS - DOSED IN MG ELEMENTAL CALCIUM) 500 MG chewable tablet Chew 1 tablet by mouth daily as needed for indigestion or heartburn.     No current facility-administered medications for this visit.   Allergies:  Pineapple   Social History: The patient  reports that she has never smoked. She has never used smokeless tobacco. She reports current alcohol use of about 1.0 standard drink of alcohol per week. She reports that she does not use drugs.   Family History: The patient's family history includes Cancer in her sister; Heart disease in her maternal grandmother.   ROS:  Please see the history of present illness. Otherwise, complete review of systems is positive for none  All other systems are reviewed and negative.   Physical Exam: VS:  BP (!) 140/82 (BP Location: Left Arm, Patient Position: Sitting, Cuff Size: Large)   Pulse 76   Ht 5' 6 (1.676 m)  Wt 253 lb 3.2 oz (114.9 kg)   SpO2 98%   BMI 40.87 kg/m , BMI Body mass index is 40.87 kg/m.  Wt Readings from Last 3 Encounters:  12/04/23 253 lb 3.2 oz (114.9 kg)  09/25/23 256 lb 8 oz (116.3 kg)  12/18/21 248 lb 3.2 oz (112.6 kg)    General: Patient appears comfortable at rest. HEENT: Conjunctiva and lids normal, oropharynx clear with moist mucosa. Neck: Supple, no elevated JVP or carotid bruits, no thyromegaly. Lungs: Clear to auscultation, nonlabored breathing at rest. Cardiac: Regular rate and rhythm, no S3 or significant systolic murmur, no pericardial rub. Abdomen: Soft, nontender, no  hepatomegaly, bowel sounds present, no guarding or rebound. Extremities: No pitting edema, distal pulses 2+. Skin: Warm and dry. Musculoskeletal: No kyphosis. Neuropsychiatric: Alert and oriented x3, affect grossly appropriate.  Recent Labwork: No results found for requested labs within last 365 days.  No results found for: CHOL, TRIG, HDL, CHOLHDL, VLDL, LDLCALC, LDLDIRECT  Other Studies Reviewed Today:   Assessment and Plan:  DOE: Ongoing DOE x 4 to 6 months.  No leg swelling.  No angina.  Obtain echocardiogram and Lexiscan. Obtain BNP.  Syncope in July 2025: No warning signs.  No recurrences.  She had a history of seizure, workup negative by PCP.  Obtain echocardiogram and Lexiscan, as above.  HTN, controlled: Continue amlodipine 5 mg once daily.   40 minutes spent in reviewing the prior records, imaging, more than 3 labs, discussion of her symptoms and documentation.  I answered all her questions.  Medication Adjustments/Labs and Tests Ordered: Current medicines are reviewed at length with the patient today.  Concerns regarding medicines are outlined above.    Disposition:  Follow up 3 months  Signed Angello Chien Priya Estelita Iten, MD, 12/04/2023 1:18 PM    [facility] at [facility] [address] [place], [place], [place] [address]

## 2023-12-04 NOTE — Patient Instructions (Signed)
 Medication Instructions:  Your physician recommends that you continue on your current medications as directed. Please refer to the Current Medication list given to you today.   Labwork: BNP to be completed today at [facility]  Testing/Procedures: Your physician has requested that you have an echocardiogram. Echocardiography is a painless test that uses sound waves to create images of your heart. It provides your doctor with information about the size and shape of your heart and how well your heart's chambers and valves are working. This procedure takes approximately one hour. There are no restrictions for this procedure. Please do NOT wear cologne, perfume, aftershave, or lotions (deodorant is allowed). Please arrive 15 minutes prior to your appointment time.  Please note: We ask at that you not bring children with you during ultrasound (echo/ vascular) testing. Due to room size and safety concerns, children are not allowed in the ultrasound rooms during exams. Our front office staff cannot provide observation of children in our lobby area while testing is being conducted. An adult accompanying a patient to their appointment will only be allowed in the ultrasound room at the discretion of the ultrasound technician under special circumstances. We apologize for any inconvenience.  Your physician has requested that you have a lexiscan myoview. For further information please visit [email or web address] Please follow instruction sheet, as given.   Follow-Up: Your physician recommends that you schedule a follow-up appointment in: 3 months  Any Other Special Instructions Will Be Listed Below (If Applicable).  If you need a refill on your cardiac medications before your next appointment, please call your pharmacy.

## 2023-12-09 ENCOUNTER — Ambulatory Visit (HOSPITAL_COMMUNITY)
Admission: RE | Admit: 2023-12-09 | Discharge: 2023-12-09 | Disposition: A | Discharge status: Home / Self Care | Source: Ambulatory Visit | Attending: Internal Medicine | Admitting: Internal Medicine

## 2023-12-09 ENCOUNTER — Other Ambulatory Visit: Payer: Self-pay | Admitting: Physician Assistant

## 2023-12-09 ENCOUNTER — Encounter (HOSPITAL_BASED_OUTPATIENT_CLINIC_OR_DEPARTMENT_OTHER)
Admission: RE | Admit: 2023-12-09 | Discharge: 2023-12-09 | Disposition: A | Discharge status: Home / Self Care | Source: Ambulatory Visit | Attending: Internal Medicine

## 2023-12-09 DIAGNOSIS — R0609 Other forms of dyspnea: Secondary | ICD-10-CM | POA: Diagnosis present

## 2023-12-09 LAB — NM MYOCAR MULTI W/SPECT W/WALL MOTION / EF
Base ST Depression (mm): 0 mm
LV dias vol: 82 mL (ref 46–106)
LV sys vol: 40 mL (ref 3.8–5.2)
MPHR: 142 {beats}/min
Nuc Stress EF: 51 %
Peak HR: 100 {beats}/min
Percent HR: 70 %
RATE: 0.5
Rest HR: 72 {beats}/min
Rest Nuclear Isotope Dose: 10.3 mCi
SDS: 1
SRS: 1
SSS: 2
ST Depression (mm): 0 mm
Stress Nuclear Isotope Dose: 33 mCi
TID: 1.11

## 2023-12-09 MED ORDER — TECHNETIUM TC 99M TETROFOSMIN IV KIT
30.0000 | PACK | Freq: Once | INTRAVENOUS | Status: AC | PRN
  Administered 2023-12-09: 33 via INTRAVENOUS

## 2023-12-09 MED ORDER — REGADENOSON 0.4 MG/5ML IV SOLN
INTRAVENOUS | Status: AC
  Administered 2023-12-09: 0.4 mg via INTRAVENOUS
  Filled 2023-12-09: qty 5

## 2023-12-09 MED ORDER — TECHNETIUM TC 99M TETROFOSMIN IV KIT
10.0000 | PACK | Freq: Once | INTRAVENOUS | Status: AC | PRN
  Administered 2023-12-09: 10.3 via INTRAVENOUS

## 2023-12-09 MED ORDER — SODIUM CHLORIDE FLUSH 0.9 % IV SOLN
INTRAVENOUS | Status: AC
  Administered 2023-12-09: 10 mL via INTRAVENOUS
  Filled 2023-12-09: qty 10

## 2023-12-09 NOTE — Progress Notes (Signed)
     Maurilio JINNY Pesa presented for a Lexiscan  nuclear stress test today.  I Lorette CINDERELLA Kapur, PA-C, provided direct supervision and was present during the stress portion of the study today, which was completed without significant symptoms, immediate complications, or acute ST/T changes on ECG.  Stress imaging is pending at this time.  Preliminary ECG findings may be listed in the chart, but the stress test result will not be finalized until perfusion imaging is complete.  Lorette CINDERELLA Kapur, PA-C  12/09/2023, 11:55 AM

## 2023-12-10 ENCOUNTER — Ambulatory Visit: Payer: Self-pay | Admitting: Internal Medicine

## 2023-12-16 MED ORDER — METOPROLOL TARTRATE 25 MG PO TABS: 25.0000 mg | ORAL_TABLET | Freq: Two times a day (BID) | ORAL | 3 refills | Status: AC

## 2023-12-16 NOTE — Telephone Encounter (Signed)
-----   Message from Vishnu P Mallipeddi sent at 12/10/2023  1:27 PM EDT ----- Abnormal stress test, apical anterior wall ischemia versus artifact. LVEF 51%, will correlate with echo. Overall low risk study. Start metoprolol  tartarate 25 mg BID. ----- Message ----- From: Mallipeddi, Vishnu P, MD Sent: 12/09/2023   4:16 PM EDT To: Vishnu P Mallipeddi, MD

## 2023-12-16 NOTE — Telephone Encounter (Signed)
 The patient has been notified of the result and verbalized understanding.  All questions (if any) were answered. Littie CHRISTELLA Croak, CMA 12/16/2023 10:51 AM

## 2023-12-23 ENCOUNTER — Ambulatory Visit: Attending: Internal Medicine

## 2023-12-23 ENCOUNTER — Telehealth: Payer: Self-pay | Admitting: Internal Medicine

## 2023-12-23 DIAGNOSIS — R0609 Other forms of dyspnea: Secondary | ICD-10-CM | POA: Diagnosis not present

## 2023-12-23 LAB — ECHOCARDIOGRAM COMPLETE
AR max vel: 2.73 cm2
AV Peak grad: 6.6 mmHg
Ao pk vel: 1.28 m/s
Area-P 1/2: 3.5 cm2
Calc EF: 56.2 %
MV M vel: 5.37 m/s
MV Peak grad: 115.3 mmHg
Single Plane A2C EF: 54.5 %
Single Plane A4C EF: 58.7 %

## 2023-12-23 NOTE — Telephone Encounter (Signed)
 Gabriela Wallace came by wanting to know if someone could explain her recent stress resuts. States that someone called her when she was sleeping and she could not remember what was said. Also, she is asking what the medication Metoprolol  tartrate is for.

## 2023-12-23 NOTE — Telephone Encounter (Signed)
 Advised patient of her results. Let her know when her echo results come in will call her with an update. Will need to continue Metoprolol . Patient verbalized understanding.

## 2024-03-12 ENCOUNTER — Ambulatory Visit: Attending: Internal Medicine | Admitting: Internal Medicine

## 2024-03-12 VITALS — BP 138/85 | HR 66 | Ht 66.0 in | Wt 248.0 lb

## 2024-03-12 DIAGNOSIS — R9439 Abnormal result of other cardiovascular function study: Secondary | ICD-10-CM | POA: Insufficient documentation

## 2024-03-12 DIAGNOSIS — I10 Essential (primary) hypertension: Secondary | ICD-10-CM | POA: Diagnosis not present

## 2024-03-12 DIAGNOSIS — R0609 Other forms of dyspnea: Secondary | ICD-10-CM | POA: Diagnosis not present

## 2024-03-12 DIAGNOSIS — R0602 Shortness of breath: Secondary | ICD-10-CM

## 2024-03-12 NOTE — Progress Notes (Signed)
 Cardiology Office Note  Date: 03/12/2024   ID: Gabriela, Wallace Gabriela Wallace, [name]  PCP:  Gabriela Vaughn FALCON, MD  Cardiologist:  Gabriela SHAUNNA Maywood, MD Electrophysiologist:  None   History of Present Illness: Gabriela Wallace is a 78 y.o. female  Here for follow-up visit of DOE. DOE x 4 to 6 months with no evidence of orthopnea, PND or leg swelling.  No angina.  No other symptoms of dizziness.  She had syncope in July 2025 with no warning signs, seizure workup negative by PCP.  No recurrence of syncope since then.  Normal echo.  Lexiscan  showed findings consistent with ischemia, small size reversible perfusion defect in the apical anterior wall, likely ischemia versus breast attenuation artifact.   Metoprolol  to tartrate 25 mg twice daily was started after the stress test was resulted.  She reported significant improvement in her DOE.  No angina.  No recurrence of syncope.  Past Medical History:  Diagnosis Date   Anxiety    Arthritis    Depression    GERD (gastroesophageal reflux disease)    Hypertension    Macular degeneration    Neuropathy    Pre-diabetes    Renal cell cancer (HCC)    Left   Seizures (HCC)    last seizure 10 yrs ago. unknown etiology and on no meds now.    Past Surgical History:  Procedure Laterality Date   ABDOMINAL HYSTERECTOMY     CATARACT EXTRACTION     right eye-Dr Gabriela Wallace   CATARACT EXTRACTION W/PHACO Right 06/09/2012   Procedure: CATARACT EXTRACTION PHACO AND INTRAOCULAR LENS PLACEMENT (IOC);  Surgeon: Gabriela Mania, MD;  Location: [facility];  Service: Ophthalmology;  Laterality: Right;  CDE=22.34   CYSTOSCOPY WITH URETEROSCOPY AND STENT PLACEMENT Left 08/31/2021   Procedure: CYSTOSCOPY WITH RETROGRADE;  Surgeon: Gabriela Glance, MD;  Location: [facility];  Service: Urology;  Laterality: Left;   KNEE ARTHROSCOPY WITH MEDIAL MENISECTOMY Right 07/27/2015   Procedure: KNEE ARTHROSCOPY WITH MEDIAL MENISECTOMY WITH REMOVAL OF LOOSE BODY;  Surgeon:  Gabriela FORBES Minerva, MD;  Location: [facility];  Service: Orthopedics;  Laterality: Right;   LAPAROSCOPIC NEPHRECTOMY Left 12/18/2021   Procedure: LAPAROSCOPIC RADICAL NEPHRECTOMY;  Surgeon: Gabriela Glance, MD;  Location: [facility];  Service: Urology;  Laterality: Left;  ONLY NEEDS 180 MIN    Current Outpatient Medications  Medication Sig Dispense Refill   acetaminophen  (TYLENOL ) 500 MG tablet Take 1,000 mg by mouth every 6 (six) hours as needed for moderate pain.     albuterol (VENTOLIN HFA) 108 (90 Base) MCG/ACT inhaler Inhale 2 puffs into the lungs as needed for wheezing or shortness of breath.     amLODipine (NORVASC) 5 MG tablet Take 1 tablet by mouth daily.     aspirin EC 81 MG tablet Take 81 mg by mouth daily.     baclofen  (LIORESAL ) 10 MG tablet Take 10 mg by mouth 3 (three) times daily as needed for muscle spasms.     calcium carbonate (TUMS - DOSED IN MG ELEMENTAL CALCIUM) 500 MG chewable tablet Chew 1 tablet by mouth daily as needed for indigestion or heartburn.     metoprolol  tartrate (LOPRESSOR ) 25 MG tablet Take 1 tablet (25 mg total) by mouth 2 (two) times daily. 60 tablet 3   olmesartan (BENICAR) 5 MG tablet Take 5 mg by mouth daily.     No current facility-administered medications for this visit.   Allergies:  Pineapple   Social History: The patient  reports  that she has never smoked. She has never used smokeless tobacco. She reports current alcohol use of about 1.0 standard drink of alcohol per week. She reports that she does not use drugs.   Family History: The patient's family history includes Cancer in her sister; Heart disease in her maternal grandmother.   ROS:  Please see the history of present illness. Otherwise, complete review of systems is positive for none  All other systems are reviewed and negative.   Physical Exam: VS:  BP 138/85 (BP Location: Right Arm)   Pulse 66   Ht 5' 6 (1.676 m)   Wt 248 lb (112.5 kg)   SpO2 95%   BMI 40.03 kg/m , BMI Body mass index is  40.03 kg/m.  Wt Readings from Last 3 Encounters:  03/12/24 248 lb (112.5 kg)  12/04/23 253 lb 3.2 oz (114.9 kg)  09/25/23 256 lb 8 oz (116.3 kg)    General: Patient appears comfortable at rest. HEENT: Conjunctiva and lids normal, oropharynx clear with moist mucosa. Neck: Supple, no elevated JVP or carotid bruits, no thyromegaly. Lungs: Clear to auscultation, nonlabored breathing at rest. Cardiac: Regular rate and rhythm, no S3 or significant systolic murmur, no pericardial rub. Abdomen: Soft, nontender, no hepatomegaly, bowel sounds present, no guarding or rebound. Extremities: No pitting edema, distal pulses 2+. Skin: Warm and dry. Musculoskeletal: No kyphosis. Neuropsychiatric: Alert and oriented x3, affect grossly appropriate.  Recent Labwork: No results found for requested labs within last 365 days.  No results found for: CHOL, TRIG, HDL, CHOLHDL, VLDL, LDLCALC, LDLDIRECT   Assessment and Plan:  DOE, positive cardiac stress test: DOE x 4 to 6 months with no evidence of orthopnea, PND or leg swelling.  Normal echo.  Lexiscan  showed findings consistent with ischemia, small size reversible perfusion defect in the apical anterior wall, likely ischemia versus breast attenuation artifact.  After starting metoprolol , DOE improved.  Continue metoprolol  tartrate 25 mg twice daily.  No angina.  She develops any angina in the future, will need LHC.  Syncope in July 2025: No warning signs.  No recurrences.  She had a history of seizure, workup negative by PCP.  No further syncopal events.  Will monitor.  HTN, controlled: Continue olmesartan 5 mg once daily and amlodipine 5 mg once daily.  Follows with PCP.   30 minutes spent in reviewing the prior records, imaging, more than 3 labs, discussion of her symptoms and documentation.  I answered all her questions.  Medication Adjustments/Labs and Tests Ordered: Current medicines are reviewed at length with the patient today.   Concerns regarding medicines are outlined above.    Disposition:  Follow up 6 months  Signed Gabriela Teschner Priya Sharnee Douglass, MD, 03/12/2024 10:29 AM    [facility] [address] [place], [place], [place] [address]

## 2024-03-12 NOTE — Patient Instructions (Signed)
